# Patient Record
Sex: Male | Born: 1985
Health system: Southern US, Community
[De-identification: ages and names within clinical notes are randomized; demographics above are authoritative.]

## PROBLEM LIST (undated history)

## (undated) DIAGNOSIS — J45909 Unspecified asthma, uncomplicated: Secondary | ICD-10-CM

## (undated) DIAGNOSIS — B019 Varicella without complication: Secondary | ICD-10-CM

## (undated) DIAGNOSIS — E559 Vitamin D deficiency, unspecified: Secondary | ICD-10-CM

## (undated) DIAGNOSIS — R569 Unspecified convulsions: Secondary | ICD-10-CM

## (undated) DIAGNOSIS — G43909 Migraine, unspecified, not intractable, without status migrainosus: Secondary | ICD-10-CM

## (undated) DIAGNOSIS — N50811 Right testicular pain: Secondary | ICD-10-CM

## (undated) HISTORY — DX: Varicella without complication: B01.9

## (undated) HISTORY — DX: Vitamin D deficiency, unspecified: E55.9

## (undated) HISTORY — DX: Right testicular pain: N50.811

## (undated) HISTORY — PX: WISDOM TOOTH EXTRACTION: SHX21

## (undated) HISTORY — DX: Unspecified asthma, uncomplicated: J45.909

## (undated) HISTORY — DX: Migraine, unspecified, not intractable, without status migrainosus: G43.909

## (undated) HISTORY — DX: Unspecified convulsions: R56.9

---

## 2006-01-04 DIAGNOSIS — K219 Gastro-esophageal reflux disease without esophagitis: Secondary | ICD-10-CM | POA: Insufficient documentation

## 2008-07-15 DIAGNOSIS — G43909 Migraine, unspecified, not intractable, without status migrainosus: Secondary | ICD-10-CM | POA: Insufficient documentation

## 2016-06-02 DIAGNOSIS — J019 Acute sinusitis, unspecified: Secondary | ICD-10-CM | POA: Diagnosis not present

## 2017-01-20 DIAGNOSIS — L255 Unspecified contact dermatitis due to plants, except food: Secondary | ICD-10-CM | POA: Diagnosis not present

## 2017-06-08 DIAGNOSIS — H6693 Otitis media, unspecified, bilateral: Secondary | ICD-10-CM | POA: Diagnosis not present

## 2017-06-15 DIAGNOSIS — H66012 Acute suppurative otitis media with spontaneous rupture of ear drum, left ear: Secondary | ICD-10-CM | POA: Diagnosis not present

## 2017-07-14 DIAGNOSIS — H6121 Impacted cerumen, right ear: Secondary | ICD-10-CM | POA: Diagnosis not present

## 2017-07-14 DIAGNOSIS — H6980 Other specified disorders of Eustachian tube, unspecified ear: Secondary | ICD-10-CM | POA: Diagnosis not present

## 2017-07-14 DIAGNOSIS — J301 Allergic rhinitis due to pollen: Secondary | ICD-10-CM | POA: Diagnosis not present

## 2017-11-07 DIAGNOSIS — S838X2A Sprain of other specified parts of left knee, initial encounter: Secondary | ICD-10-CM | POA: Diagnosis not present

## 2017-11-23 DIAGNOSIS — M25562 Pain in left knee: Secondary | ICD-10-CM | POA: Diagnosis not present

## 2017-11-28 DIAGNOSIS — M25562 Pain in left knee: Secondary | ICD-10-CM | POA: Diagnosis not present

## 2017-11-30 DIAGNOSIS — M25562 Pain in left knee: Secondary | ICD-10-CM | POA: Diagnosis not present

## 2017-12-12 ENCOUNTER — Encounter

## 2017-12-15 ENCOUNTER — Ambulatory Visit (INDEPENDENT_AMBULATORY_CARE_PROVIDER_SITE_OTHER): Payer: BLUE CROSS/BLUE SHIELD | Admitting: Internal Medicine

## 2017-12-15 ENCOUNTER — Encounter: Payer: Self-pay | Admitting: Internal Medicine

## 2017-12-15 ENCOUNTER — Encounter

## 2017-12-15 ENCOUNTER — Other Ambulatory Visit: Payer: Self-pay | Admitting: Internal Medicine

## 2017-12-15 VITALS — BP 120/70 | HR 65 | Temp 98.2°F | Ht 66.0 in | Wt 194.6 lb

## 2017-12-15 DIAGNOSIS — Z0184 Encounter for antibody response examination: Secondary | ICD-10-CM | POA: Diagnosis not present

## 2017-12-15 DIAGNOSIS — Z1329 Encounter for screening for other suspected endocrine disorder: Secondary | ICD-10-CM | POA: Diagnosis not present

## 2017-12-15 DIAGNOSIS — R1031 Right lower quadrant pain: Secondary | ICD-10-CM

## 2017-12-15 DIAGNOSIS — Z87898 Personal history of other specified conditions: Secondary | ICD-10-CM | POA: Diagnosis not present

## 2017-12-15 DIAGNOSIS — E559 Vitamin D deficiency, unspecified: Secondary | ICD-10-CM

## 2017-12-15 DIAGNOSIS — G43009 Migraine without aura, not intractable, without status migrainosus: Secondary | ICD-10-CM | POA: Diagnosis not present

## 2017-12-15 DIAGNOSIS — Z1159 Encounter for screening for other viral diseases: Secondary | ICD-10-CM | POA: Diagnosis not present

## 2017-12-15 DIAGNOSIS — M25562 Pain in left knee: Secondary | ICD-10-CM

## 2017-12-15 DIAGNOSIS — Z0000 Encounter for general adult medical examination without abnormal findings: Secondary | ICD-10-CM | POA: Diagnosis not present

## 2017-12-15 LAB — CBC WITH DIFFERENTIAL/PLATELET
BASOS ABS: 0 10*3/uL (ref 0.0–0.1)
BASOS PCT: 0.7 % (ref 0.0–3.0)
EOS ABS: 0 10*3/uL (ref 0.0–0.7)
Eosinophils Relative: 1.2 % (ref 0.0–5.0)
HEMATOCRIT: 44.9 % (ref 39.0–52.0)
HEMOGLOBIN: 15.3 g/dL (ref 13.0–17.0)
LYMPHS PCT: 32.8 % (ref 12.0–46.0)
Lymphs Abs: 1.3 10*3/uL (ref 0.7–4.0)
MCHC: 34.1 g/dL (ref 30.0–36.0)
MCV: 84.5 fl (ref 78.0–100.0)
Monocytes Absolute: 0.4 10*3/uL (ref 0.1–1.0)
Monocytes Relative: 8.9 % (ref 3.0–12.0)
Neutro Abs: 2.2 10*3/uL (ref 1.4–7.7)
Neutrophils Relative %: 56.4 % (ref 43.0–77.0)
Platelets: 227 10*3/uL (ref 150.0–400.0)
RBC: 5.32 Mil/uL (ref 4.22–5.81)
RDW: 13.3 % (ref 11.5–15.5)
WBC: 4 10*3/uL (ref 4.0–10.5)

## 2017-12-15 LAB — VITAMIN D 25 HYDROXY (VIT D DEFICIENCY, FRACTURES): VITD: 20.2 ng/mL — AB (ref 30.00–100.00)

## 2017-12-15 LAB — COMPREHENSIVE METABOLIC PANEL
ALBUMIN: 5 g/dL (ref 3.5–5.2)
ALT: 14 U/L (ref 0–53)
AST: 16 U/L (ref 0–37)
Alkaline Phosphatase: 70 U/L (ref 39–117)
BILIRUBIN TOTAL: 0.6 mg/dL (ref 0.2–1.2)
BUN: 13 mg/dL (ref 6–23)
CALCIUM: 10 mg/dL (ref 8.4–10.5)
CHLORIDE: 104 meq/L (ref 96–112)
CO2: 31 mEq/L (ref 19–32)
Creatinine, Ser: 1.05 mg/dL (ref 0.40–1.50)
GFR: 87.47 mL/min (ref >60.00)
Glucose, Bld: 99 mg/dL (ref 70–99)
Potassium: 4.8 mEq/L (ref 3.5–5.1)
Sodium: 140 mEq/L (ref 135–145)
Total Protein: 7.4 g/dL (ref 6.0–8.3)

## 2017-12-15 LAB — T4, FREE: FREE T4: 0.87 ng/dL (ref 0.60–1.60)

## 2017-12-15 LAB — TSH: TSH: 2.01 u[IU]/mL (ref 0.35–4.50)

## 2017-12-15 MED ORDER — CHOLECALCIFEROL 1.25 MG (50000 UT) PO CAPS: 50000.0000 [IU] | ORAL_CAPSULE | ORAL | 1 refills | Status: DC

## 2017-12-15 NOTE — Progress Notes (Signed)
Pre visit review using our clinic review tool, if applicable. No additional management support is needed unless otherwise documented below in the visit note. 

## 2017-12-15 NOTE — Patient Instructions (Addendum)
Consider Tdap after disc. With mom and if you are sure this did not cause seizures  F/u in 1-2 months  Will order ct ab pelvis with contrast at [facility]  Kidney Stones Kidney stones (urolithiasis) are solid, rock-like deposits that form inside of the organs that make urine (kidneys). A kidney stone may form in a kidney and move into the bladder, where it can cause intense pain and block the flow of urine. Kidney stones are created when high levels of certain minerals are found in the urine. They are usually passed through urination, but in some cases, medical treatment may be needed to remove them. What are the causes? Kidney stones may be caused by:  A condition in which certain glands produce too much parathyroid hormone (primary hyperparathyroidism), which causes too much calcium buildup in the blood.  Buildup of uric acid crystals in the bladder (hyperuricosuria). Uric acid is a chemical that the body produces when you eat certain foods. It usually exits the body in the urine.  Narrowing (stricture) of one or both of the tubes that drain urine from the kidneys to the bladder (ureters).  A kidney blockage that is present at birth (congenital obstruction).  Past surgery on the kidney or the ureters, such as gastric bypass surgery.  What increases the risk? The following factors make you more likely to develop kidney stones:  Having had a kidney stone in the past.  Having a family history of kidney stones.  Not drinking enough water.  Eating a diet that is high in protein, salt (sodium), or sugar.  Being overweight or obese.  What are the signs or symptoms? Symptoms of a kidney stone may include:  Nausea.  Vomiting.  Blood in the urine (hematuria).  Pain in the side of the abdomen, right below the ribs (flank pain). Pain usually spreads (radiates) to the groin.  Needing to urinate frequently or urgently.  How is this diagnosed? This condition may be diagnosed based  on:  Your medical history.  A physical exam.  Blood tests.  Urine tests.  CT scan.  Abdominal X-ray.  A procedure to examine the inside of the bladder (cystoscopy).  How is this treated? Treatment for kidney stones depends on the size, location, and makeup of the stones. Treatment may involve:  Analyzing your urine before and after you pass the stone through urination.  Being monitored at the hospital until you pass the stone through urination.  Increasing your fluid intake and decreasing the amount of calcium and protein in your diet.  A procedure to break up kidney stones in the bladder using: ? A focused beam of light (laser therapy). ? Shock waves (extracorporeal shock wave lithotripsy).  Surgery to remove kidney stones. This may be needed if you have severe pain or have stones that block your urinary tract.  Follow these instructions at home: Eating and drinking   Drink enough fluid to keep your urine clear or pale yellow. This will help you to pass the kidney stone.  If directed, change your diet. This may include: ? Limiting how much sodium you eat. ? Eating more fruits and vegetables. ? Limiting how much meat, poultry, fish, and eggs you eat.  Follow instructions from your health care provider about eating or drinking restrictions. General instructions  Collect urine samples as told by your health care provider. You may need to collect a urine sample: ? 24 hours after you pass the stone. ? 8-12 weeks after passing the kidney stone, and  every 6-12 months after that.  Strain your urine every time you urinate, for as long as directed. Use the strainer that your health care provider recommends.  Do not throw out the kidney stone after passing it. Keep the stone so it can be tested by your health care provider. Testing the makeup of your kidney stone may help prevent you from getting kidney stones in the future.  Take over-the-counter and prescription medicines  only as told by your health care provider.  Keep all follow-up visits as told by your health care provider. This is important. You may need follow-up X-rays or ultrasounds to make sure that your stone has passed. How is this prevented? To prevent another kidney stone:  Drink enough fluid to keep your urine clear or pale yellow. This is the best way to prevent kidney stones.  Eat a healthy diet and follow recommendations from your health care provider about foods to avoid. You may be instructed to eat a low-protein diet. Recommendations vary depending on the type of kidney stone that you have.  Maintain a healthy weight.  Contact a health care provider if:  You have pain that gets worse or does not get better with medicine. Get help right away if:  You have a fever or chills.  You develop severe pain.  You develop new abdominal pain.  You faint.  You are unable to urinate. This information is not intended to replace advice given to you by your health care provider. Make sure you discuss any questions you have with your health care provider. Document Released: 06/07/2005 Document Revised: 12/26/2015 Document Reviewed: 11/21/2015 Elsevier Interactive Patient Education  2018 [employer].  Appendicitis The appendix is a finger-shaped tube that is attached to the large intestine. Appendicitis is inflammation of the appendix. Without treatment, appendicitis can cause the appendix to tear (rupture). A ruptured appendix can lead to a life-threatening infection. It can also lead to the formation of a painful collection of pus (abscess) in the appendix. What are the causes? This condition may be caused by a blockage in the appendix that leads to infection. The blockage can be due to:  A ball of stool.  Enlarged lymph glands.  In some cases, the cause may not be known. What increases the risk? This condition is more likely to develop in people who are 40-56 years of age. What are the  signs or symptoms? Symptoms of this condition include:  Pain around the belly button that moves toward the lower right abdomen. The pain can become more severe as time passes. It gets worse with coughing or sudden movements.  Tenderness in the lower right abdomen.  Nausea.  Vomiting.  Loss of appetite.  Fever.  Constipation.  Diarrhea.  Generally not feeling well.  How is this diagnosed? This condition may be diagnosed with:  A physical exam.  Blood tests.  Urine test.  To confirm the diagnosis, an ultrasound, MRI, or CT scan may be done. How is this treated? This condition is usually treated by taking out the appendix (appendectomy). There are two methods for doing an appendectomy:  Open appendectomy. In this surgery, the appendix is removed through a large cut (incision) that is made in the lower right abdomen. This procedure may be recommended if: ? You have major scarring from a previous surgery. ? You have a bleeding disorder. ? You are pregnant and are near term. ? You have a condition that makes the laparoscopic procedure impossible, such as an advanced infection  or a ruptured appendix.  Laparoscopic appendectomy. In this surgery, the appendix is removed through small incisions. This procedure usually causes less pain and fewer problems than an open appendectomy. It also has a shorter recovery time.  If the appendix has ruptured and an abscess has formed, a drain may be placed into the abscess to remove fluid and antibiotic medicines may be given through an IV tube. The appendix may or may not need to be removed. This information is not intended to replace advice given to you by your health care provider. Make sure you discuss any questions you have with your health care provider. Document Released: 06/07/2005 Document Revised: 10/15/2015 Document Reviewed: 10/23/2014 Elsevier Interactive Patient Education  2017 [employer]  Hernia, Adult A hernia is the  bulging of an organ or tissue through a weak spot in the muscles of the abdomen (abdominal wall). Hernias develop most often near the navel or groin. There are many kinds of hernias. Common kinds include:  Femoral hernia. This kind of hernia develops under the groin in the upper thigh area.  Inguinal hernia. This kind of hernia develops in the groin or scrotum.  Umbilical hernia. This kind of hernia develops near the navel.  Hiatal hernia. This kind of hernia causes part of the stomach to be pushed up into the chest.  Incisional hernia. This kind of hernia bulges through a scar from an abdominal surgery.  What are the causes? This condition may be caused by:  Heavy lifting.  Coughing over a long period of time.  Straining to have a bowel movement.  An incision made during an abdominal surgery.  A birth defect (congenital defect).  Excess weight or obesity.  Smoking.  Poor nutrition.  Cystic fibrosis.  Excess fluid in the abdomen.  Undescended testicles.  What are the signs or symptoms? Symptoms of a hernia include:  A lump on the abdomen. This is the first sign of a hernia. The lump may become more obvious with standing, straining, or coughing. It may get bigger over time if it is not treated or if the condition causing it is not treated.  Pain. A hernia is usually painless, but it may become painful over time if treatment is delayed. The pain is usually dull and may get worse with standing or lifting heavy objects.  Sometimes a hernia gets tightly squeezed in the weak spot (strangulated) or stuck there (incarcerated) and causes additional symptoms. These symptoms may include:  Vomiting.  Nausea.  Constipation.  Irritability.  How is this diagnosed? A hernia may be diagnosed with:  A physical exam. During the exam your health care provider may ask you to cough or to make a specific movement, because a hernia is usually more visible when you move.  Imaging  tests. These can include: ? X-rays. ? Ultrasound. ? CT scan.  How is this treated? A hernia that is small and painless may not need to be treated. A hernia that is large or painful may be treated with surgery. Inguinal hernias may be treated with surgery to prevent incarceration or strangulation. Strangulated hernias are always treated with surgery, because lack of blood to the trapped organ or tissue can cause it to die. Surgery to treat a hernia involves pushing the bulge back into place and repairing the weak part of the abdomen. Follow these instructions at home:  Avoid straining.  Do not lift anything heavier than 10 lb (4.5 kg).  Lift with your leg muscles, not your back muscles.  This helps avoid strain.  When coughing, try to cough gently.  Prevent constipation. Constipation leads to straining with bowel movements, which can make a hernia worse or cause a hernia repair to break down. You can prevent constipation by: ? Eating a high-fiber diet that includes plenty of fruits and vegetables. ? Drinking enough fluids to keep your urine clear or pale yellow. Aim to drink 6-8 glasses of water per day. ? Using a stool softener as directed by your health care provider.  Lose weight, if you are overweight.  Do not use any tobacco products, including cigarettes, chewing tobacco, or electronic cigarettes. If you need help quitting, ask your health care provider.  Keep all follow-up visits as directed by your health care provider. This is important. Your health care provider may need to monitor your condition. Contact a health care provider if:  You have swelling, redness, and pain in the affected area.  Your bowel habits change. Get help right away if:  You have a fever.  You have abdominal pain that is getting worse.  You feel nauseous or you vomit.  You cannot push the hernia back in place by gently pressing on it while you are lying down.  The hernia: ? Changes in shape or  size. ? Is stuck outside the abdomen. ? Becomes discolored. ? Feels hard or tender. This information is not intended to replace advice given to you by your health care provider. Make sure you discuss any questions you have with your health care provider. Document Released: 06/07/2005 Document Revised: 11/05/2015 Document Reviewed: 04/17/2014 Elsevier Interactive Patient Education  2017 Elsevier Inc.  Abdominal Pain, Adult Abdominal pain can be caused by many things. Often, abdominal pain is not serious and it gets better with no treatment or by being treated at home. However, sometimes abdominal pain is serious. Your health care provider will do a medical history and a physical exam to try to determine the cause of your abdominal pain. Follow these instructions at home:  Take over-the-counter and prescription medicines only as told by your health care provider. Do not take a laxative unless told by your health care provider.  Drink enough fluid to keep your urine clear or pale yellow.  Watch your condition for any changes.  Keep all follow-up visits as told by your health care provider. This is important. Contact a health care provider if:  Your abdominal pain changes or gets worse.  You are not hungry or you lose weight without trying.  You are constipated or have diarrhea for more than 2-3 days.  You have pain when you urinate or have a bowel movement.  Your abdominal pain wakes you up at night.  Your pain gets worse with meals, after eating, or with certain foods.  You are throwing up and cannot keep anything down.  You have a fever. Get help right away if:  Your pain does not go away as soon as your health care provider told you to expect.  You cannot stop throwing up.  Your pain is only in areas of the abdomen, such as the right side or the left lower portion of the abdomen.  You have bloody or black stools, or stools that look like tar.  You have severe pain,  cramping, or bloating in your abdomen.  You have signs of dehydration, such as: ? Dark urine, very little urine, or no urine. ? Cracked lips. ? Dry mouth. ? Sunken eyes. ? Sleepiness. ? Weakness. This information is not  intended to replace advice given to you by your health care provider. Make sure you discuss any questions you have with your health care provider. Document Released: 03/17/2005 Document Revised: 12/26/2015 Document Reviewed: 11/19/2015 Elsevier Interactive Patient Education  [employer].

## 2017-12-15 NOTE — Progress Notes (Signed)
Chief Complaint  Patient presents with  . New Patient (Initial Visit)   NP with wife  1. C/o RLQ ab pain x several months he thinks he may have hernia but no bulge at times pain is constant worse with heavy lifting and pain radiates to right groin. He also has been having cold spells x 2 months. Nothing tried  2. Pertussis unclear if had seizure when was a kid mother says so baby on the way and wants to know if should get vaccine no seizures since age 31-6 y.o  3. C/o left knee injury while running x 1.5 month ago in PT 2x per week and getter better  4. H/o migraines worse with stress w/o aura bothered by light and sound tries ibuprofen and excedrine with some help drinks 2-3 cups coffee daily no soda or tea sleeping 6-8 hrs qhs mom and sister h/o migraines.    Review of Systems  Constitutional: Positive for chills. Negative for fever and weight loss.  HENT: Negative for hearing loss.   Eyes: Negative for blurred vision.  Respiratory: Negative for shortness of breath.   Cardiovascular: Negative for chest pain.  Gastrointestinal: Positive for abdominal pain. Negative for nausea and vomiting.  Musculoskeletal: Positive for joint pain.  Skin: Negative for rash.  Neurological: Negative for seizures and headaches.  Psychiatric/Behavioral: Negative for depression.   Past Medical History:  Diagnosis Date  . Asthma    childhood with exercise   . Chicken pox   . Migraines   . Seizures ([place])    Past Surgical History:  Procedure Laterality Date  . WISDOM TOOTH EXTRACTION     Family History  Problem Relation Age of Onset  . Arthritis Mother   . Migraines Mother   . Cancer Father        skin mm  . Hypertension Father   . Migraines Sister   . COPD Maternal Grandmother   . COPD Paternal Grandfather   . Diabetes Paternal Grandfather   . Heart disease Paternal Grandfather    Social History   Socioeconomic History  . Marital status: Married    Spouse name: Not on file  . Number of  children: Not on file  . Years of education: Not on file  . Highest education level: Not on file  Occupational History  . Not on file  Social Needs  . Financial resource strain: Not on file  . Food insecurity:    Worry: Not on file    Inability: Not on file  . Transportation needs:    Medical: Not on file    Non-medical: Not on file  Tobacco Use  . Smoking status: Former Research scientist (life sciences)  . Smokeless tobacco: Never Used  . Tobacco comment: age [age] 1pk lasted 4-5 days   Substance and Sexual Activity  . Alcohol use: Yes  . Drug use: Not Currently  . Sexual activity: Yes    Partners: Female  Lifestyle  . Physical activity:    Days per week: Not on file    Minutes per session: Not on file  . Stress: Not on file  Relationships  . Social connections:    Talks on phone: Not on file    Gets together: Not on file    Attends religious service: Not on file    Active member of club or organization: Not on file    Attends meetings of clubs or organizations: Not on file    Relationship status: Not on file  . Intimate partner violence:  Fear of current or ex partner: Not on file    Emotionally abused: Not on file    Physically abused: Not on file    Forced sexual activity: Not on file  Other Topics Concern  . Not on file  Social History Narrative   Married wife expecting baby 12/2017    [employer] ed program manager    No guns    Wears selt belt    Safe in relationship       Current Meds  Medication Sig  . loratadine (CLARITIN) 10 MG tablet Take 10 mg by mouth daily.  Marland Kitchen omeprazole (PRILOSEC) 10 MG capsule Take 10 mg by mouth daily.   Allergies  Allergen Reactions  . Pertussis Vaccines     ? Cause of seizure  . Tape Itching    Rash    No results found for this or any previous visit (from the past 2160 hour(s)). Objective  Body mass index is 31.41 kg/m. Wt Readings from Last 3 Encounters:  12/15/17 194 lb 9.6 oz (88.3 kg)   Temp Readings from Last 3 Encounters:  12/15/17 98.2  F (36.8 C) (Oral)   BP Readings from Last 3 Encounters:  12/15/17 120/70   Pulse Readings from Last 3 Encounters:  12/15/17 65    Physical Exam  Constitutional: He is oriented to person, place, and time. Vital signs are normal. He appears well-developed and well-nourished. He is cooperative.  HENT:  Head: Normocephalic and atraumatic.  Mouth/Throat: Oropharynx is clear and moist and mucous membranes are normal.  Eyes: Pupils are equal, round, and reactive to light. Conjunctivae are normal.  Cardiovascular: Normal rate, regular rhythm and normal heart sounds.  Pulmonary/Chest: Effort normal and breath sounds normal.  Abdominal: Soft. Bowel sounds are normal. He exhibits no mass. There is tenderness in the right lower quadrant. There is no rebound and no guarding. No hernia.    Neurological: He is alert and oriented to person, place, and time. Gait normal.  Skin: Skin is warm, dry and intact.     KP and acne   Psychiatric: He has a normal mood and affect. His speech is normal and behavior is normal. Judgment and thought content normal. Cognition and memory are normal.  Nursing note and vitals reviewed.   Assessment   1. RLQ ab pain ddx kidney stone, appedix, hernia   2. H/o seizures ? If to pertussis last seizure age 67 or 32 y.o  3. Left knee pain improved with PT  4. Migraines  5. HM Plan   1. Labs and CT ab/pelvis with contrast  2. Hold Tdap until finds out more info from mom  3. Cont pt 4. Disc further tx migraines if needed in future  Will cont nsaids and otc excedrine  5.  Had flu shot 2018  Td had had 2004 not had ap since child  Hep B immune  Check MMR  Labs today  Consider lipid fasting in future  No STD testing    Provider: Dr. Olivia Mackie McLean-Scocuzza-[facility]

## 2017-12-16 LAB — URINALYSIS, ROUTINE W REFLEX MICROSCOPIC
BILIRUBIN UA: NEGATIVE
Glucose, UA: NEGATIVE
KETONES UA: NEGATIVE
LEUKOCYTES UA: NEGATIVE
Nitrite, UA: NEGATIVE
PH UA: 8.5 — AB (ref 5.0–7.5)
PROTEIN UA: NEGATIVE
RBC UA: NEGATIVE
Specific Gravity, UA: 1.006 (ref 1.005–1.030)
UUROB: 0.2 mg/dL (ref 0.2–1.0)

## 2017-12-19 LAB — MEASLES/MUMPS/RUBELLA IMMUNITY
MUMPS IGG: 40.5 [AU]/ml
RUBELLA: 1.62 {index}

## 2017-12-20 ENCOUNTER — Encounter: Payer: Self-pay | Admitting: *Deleted

## 2017-12-20 ENCOUNTER — Encounter (INDEPENDENT_AMBULATORY_CARE_PROVIDER_SITE_OTHER): Payer: Self-pay

## 2017-12-24 DIAGNOSIS — Z23 Encounter for immunization: Secondary | ICD-10-CM | POA: Diagnosis not present

## 2017-12-28 ENCOUNTER — Ambulatory Visit
Admission: RE | Admit: 2017-12-28 | Discharge: 2017-12-28 | Disposition: A | Discharge status: Home / Self Care | Payer: BLUE CROSS/BLUE SHIELD | Source: Ambulatory Visit | Attending: Internal Medicine | Admitting: Internal Medicine

## 2017-12-28 DIAGNOSIS — R1031 Right lower quadrant pain: Secondary | ICD-10-CM | POA: Diagnosis not present

## 2017-12-28 MED ORDER — IOPAMIDOL (ISOVUE-300) INJECTION 61%
100.0000 mL | Freq: Once | INTRAVENOUS | Status: AC | PRN
  Administered 2017-12-28: 100 mL via INTRAVENOUS

## 2017-12-29 ENCOUNTER — Encounter: Payer: Self-pay | Admitting: Internal Medicine

## 2018-02-27 ENCOUNTER — Ambulatory Visit: Payer: BLUE CROSS/BLUE SHIELD | Admitting: Internal Medicine

## 2018-02-27 DIAGNOSIS — Z0289 Encounter for other administrative examinations: Secondary | ICD-10-CM

## 2018-04-02 DIAGNOSIS — Z23 Encounter for immunization: Secondary | ICD-10-CM | POA: Diagnosis not present

## 2018-05-08 ENCOUNTER — Telehealth: Payer: Self-pay

## 2018-05-08 ENCOUNTER — Other Ambulatory Visit: Payer: Self-pay | Admitting: Internal Medicine

## 2018-05-08 DIAGNOSIS — N50819 Testicular pain, unspecified: Secondary | ICD-10-CM

## 2018-05-08 NOTE — Telephone Encounter (Signed)
Please order testicular US for testicular pain   Pt also needs to schedule f/u with me   Thanks tMS

## 2018-05-08 NOTE — Telephone Encounter (Signed)
Ultrasound is scheduled 11/21 and his follow up with you is on 11/26. Thx, melissa

## 2018-05-08 NOTE — Telephone Encounter (Signed)
Copied from CRM [phone]. Topic: Referral - Request for Referral >> May 08, 2018 10:57 AM Crist InfanteHarrald, Kathy J wrote: Has patient seen PCP for this complaint? yes Pt had CT done in June, and was advised to have an ultrasound it that did not show anything. Pt just had a baby, and that is why he waited so long to follow up with this. Pt states he is still having testicular pain. If pt doesn not answer, ok to call wife.

## 2018-05-11 ENCOUNTER — Ambulatory Visit
Admission: RE | Admit: 2018-05-11 | Discharge: 2018-05-11 | Disposition: A | Discharge status: Home / Self Care | Payer: BLUE CROSS/BLUE SHIELD | Source: Ambulatory Visit | Attending: Internal Medicine | Admitting: Internal Medicine

## 2018-05-11 DIAGNOSIS — N50819 Testicular pain, unspecified: Secondary | ICD-10-CM

## 2018-05-12 ENCOUNTER — Other Ambulatory Visit: Payer: Self-pay | Admitting: Internal Medicine

## 2018-05-12 DIAGNOSIS — N50819 Testicular pain, unspecified: Secondary | ICD-10-CM

## 2018-05-12 DIAGNOSIS — R1031 Right lower quadrant pain: Secondary | ICD-10-CM

## 2018-05-16 ENCOUNTER — Ambulatory Visit (INDEPENDENT_AMBULATORY_CARE_PROVIDER_SITE_OTHER): Payer: BLUE CROSS/BLUE SHIELD | Admitting: Internal Medicine

## 2018-05-16 ENCOUNTER — Encounter: Payer: Self-pay | Admitting: Internal Medicine

## 2018-05-16 VITALS — BP 122/96 | HR 71 | Temp 97.6°F | Ht 66.0 in | Wt 204.8 lb

## 2018-05-16 DIAGNOSIS — L858 Other specified epidermal thickening: Secondary | ICD-10-CM | POA: Diagnosis not present

## 2018-05-16 DIAGNOSIS — E669 Obesity, unspecified: Secondary | ICD-10-CM | POA: Insufficient documentation

## 2018-05-16 DIAGNOSIS — Z0000 Encounter for general adult medical examination without abnormal findings: Secondary | ICD-10-CM | POA: Diagnosis not present

## 2018-05-16 DIAGNOSIS — Z13220 Encounter for screening for lipoid disorders: Secondary | ICD-10-CM | POA: Diagnosis not present

## 2018-05-16 DIAGNOSIS — N50811 Right testicular pain: Secondary | ICD-10-CM | POA: Insufficient documentation

## 2018-05-16 DIAGNOSIS — E559 Vitamin D deficiency, unspecified: Secondary | ICD-10-CM

## 2018-05-16 NOTE — Progress Notes (Signed)
Chief Complaint  Patient presents with  . Follow-up   Annual  1. She c/o right testicular pain and groin pain in scrotum feels like pressure/pain/pulling/stabbing sensation worse with lying in bed at times ? Trigger. No bulge. At times pt notes testicle on right is out of position and sitting higher on the right and angle changes  2. Vit D def taking D3 50K weekly     Review of Systems  Constitutional: Negative for weight loss.  HENT: Negative for hearing loss.   Eyes: Negative for blurred vision.  Respiratory: Negative for shortness of breath.   Cardiovascular: Negative for chest pain.  Gastrointestinal: Negative for abdominal pain.  Genitourinary:       +right testicular pain    Musculoskeletal: Negative for falls.  Skin: Negative for rash.  Neurological: Negative for headaches.  Psychiatric/Behavioral: Negative for depression.   Past Medical History:  Diagnosis Date  . Asthma    childhood with exercise   . Chicken pox   . Migraines   . Pain in right testicle   . Seizures ([place])   . Vitamin D deficiency    Past Surgical History:  Procedure Laterality Date  . WISDOM TOOTH EXTRACTION     Family History  Problem Relation Age of Onset  . Arthritis Mother   . Migraines Mother   . Cancer Father        skin mm  . Hypertension Father   . Migraines Sister   . COPD Maternal Grandmother   . COPD Paternal Grandfather   . Diabetes Paternal Grandfather   . Heart disease Paternal Grandfather    Social History   Socioeconomic History  . Marital status: Married    Spouse name: Not on file  . Number of children: Not on file  . Years of education: Not on file  . Highest education level: Not on file  Occupational History  . Not on file  Social Needs  . Financial resource strain: Not on file  . Food insecurity:    Worry: Not on file    Inability: Not on file  . Transportation needs:    Medical: Not on file    Non-medical: Not on file  Tobacco Use  . Smoking status:  Former Research scientist (life sciences)  . Smokeless tobacco: Never Used  . Tobacco comment: age [age] 1pk lasted 4-5 days   Substance and Sexual Activity  . Alcohol use: Yes  . Drug use: Not Currently  . Sexual activity: Yes    Partners: Female  Lifestyle  . Physical activity:    Days per week: Not on file    Minutes per session: Not on file  . Stress: Not on file  Relationships  . Social connections:    Talks on phone: Not on file    Gets together: Not on file    Attends religious service: Not on file    Active member of club or organization: Not on file    Attends meetings of clubs or organizations: Not on file    Relationship status: Not on file  . Intimate partner violence:    Fear of current or ex partner: Not on file    Emotionally abused: Not on file    Physically abused: Not on file    Forced sexual activity: Not on file  Other Topics Concern  . Not on file  Social History Narrative   Married wife expecting baby 12/2017    HS ed program manager    No guns  Wears selt belt    Safe in relationship    4 month girl as of 05/16/18    Current Meds  Medication Sig  . Cholecalciferol 50000 units capsule Take 1 capsule (50,000 Units total) by mouth once a week.  . loratadine (CLARITIN) 10 MG tablet Take 10 mg by mouth daily.  Marland Kitchen omeprazole (PRILOSEC) 10 MG capsule Take 10 mg by mouth daily.   Allergies  Allergen Reactions  . Pertussis Vaccines     ? Cause of seizure  . Tape Itching    Rash    No results found for this or any previous visit (from the past 2160 hour(s)). Objective  Body mass index is 33.06 kg/m. Wt Readings from Last 3 Encounters:  05/16/18 204 lb 12.8 oz (92.9 kg)  12/15/17 194 lb 9.6 oz (88.3 kg)   Temp Readings from Last 3 Encounters:  05/16/18 97.6 F (36.4 C) (Oral)  12/15/17 98.2 F (36.8 C) (Oral)   BP Readings from Last 3 Encounters:  05/16/18 (!) 122/96  12/15/17 120/70   Pulse Readings from Last 3 Encounters:  05/16/18 71  12/15/17 65     Physical Exam  Constitutional: He is oriented to person, place, and time. Vital signs are normal. He appears well-developed and well-nourished. He is cooperative.  HENT:  Head: Normocephalic and atraumatic.  Mouth/Throat: Oropharynx is clear and moist and mucous membranes are normal.  Eyes: Pupils are equal, round, and reactive to light. Conjunctivae are normal.  Cardiovascular: Normal rate, regular rhythm and normal heart sounds.  Pulmonary/Chest: Effort normal and breath sounds normal.  Neurological: He is alert and oriented to person, place, and time. Gait normal.  Skin: Skin is warm, dry and intact.  Keratosis pilaris to trunk, arms.    Psychiatric: He has a normal mood and affect. His speech is normal and behavior is normal. Judgment and thought content normal. Cognition and memory are normal.  Nursing note and vitals reviewed. benign nevi to trunk   Assessment   1. Annual 2. Right testicular pain  3. Vitamin D def  Plan   1.  Had flu shot 2019 Tdap utd  Hep B immune  MMR immune  sch fasting lipid  No STD testing  rec exercise to lose and healthy diet choices Can exfoliate and use amlactin for KP to body   2. Refer to urology CT ab/pelvis and testicular US negative  3. D3 weekly x 6 months then otc D3 5000 IU qd    Provider: Dr. Olivia Mackie McLean-Scocuzza-[facility]

## 2018-05-16 NOTE — Patient Instructions (Addendum)
Please see urology about testicular pain   Vitamin D x6 months weekly then daily 5000 IU D3 otc starting month 7  Amlactin lotion or exforiate for keratosis pilaris    Vitamin D Deficiency Vitamin D deficiency is when your body does not have enough vitamin D. Vitamin D is important to your body for many reasons:  It helps the body to absorb two important minerals, called calcium and phosphorus.  It plays a role in bone health.  It may help to prevent some diseases, such as diabetes and multiple sclerosis.  It plays a role in muscle function, including heart function.  You can get vitamin D by:  Eating foods that naturally contain vitamin D.  Eating or drinking milk or other dairy products that have vitamin D added to them.  Taking a vitamin D supplement or a multivitamin supplement that contains vitamin D.  Being in the sun. Your body naturally makes vitamin D when your skin is exposed to sunlight. Your body changes the sunlight into a form of the vitamin that the body can use.  If vitamin D deficiency is severe, it can cause a condition in which your bones become soft. In adults, this condition is called osteomalacia. In children, this condition is called rickets. What are the causes? Vitamin D deficiency may be caused by:  Not eating enough foods that contain vitamin D.  Not getting enough sun exposure.  Having certain digestive system diseases that make it difficult for your body to absorb vitamin D. These diseases include Crohn disease, chronic pancreatitis, and cystic fibrosis.  Having a surgery in which a part of the stomach or a part of the small intestine is removed.  Being obese.  Having chronic kidney disease or liver disease.  What increases the risk? This condition is more likely to develop in:  Older people.  People who do not spend much time outdoors.  People who live in a long-term care facility.  People who have had broken bones.  People with  weak or thin bones (osteoporosis).  People who have a disease or condition that changes how the body absorbs vitamin D.  People who have dark skin.  People who take certain medicines, such as steroid medicines or certain seizure medicines.  People who are overweight or obese.  What are the signs or symptoms? In mild cases of vitamin D deficiency, there may not be any symptoms. If the condition is severe, symptoms may include:  Bone pain.  Muscle pain.  Falling often.  Broken bones caused by a minor injury.  How is this diagnosed? This condition is usually diagnosed with a blood test. How is this treated? Treatment for this condition may depend on what caused the condition. Treatment options include:  Taking vitamin D supplements.  Taking a calcium supplement. Your health care provider will suggest what dose is best for you.  Follow these instructions at home:  Take medicines and supplements only as told by your health care provider.  Eat foods that contain vitamin D. Choices include: ? Fortified dairy products, cereals, or juices. Fortified means that vitamin D has been added to the food. Check the label on the package to be sure. ? Fatty fish, such as salmon or trout. ? Eggs. ? Oysters.  Do not use a tanning bed.  Maintain a healthy weight. Lose weight, if needed.  Keep all follow-up visits as told by your health care provider. This is important. Contact a health care provider if:  Your symptoms  do not go away.  You feel like throwing up (nausea) or you throw up (vomit).  You have fewer bowel movements than usual or it is difficult for you to have a bowel movement (constipation). This information is not intended to replace advice given to you by your health care provider. Make sure you discuss any questions you have with your health care provider. Document Released: 08/30/2011 Document Revised: 11/19/2015 Document Reviewed: 10/23/2014 Elsevier Interactive  Patient Education  2018 [employer].

## 2018-05-16 NOTE — Progress Notes (Signed)
Pre visit review using our clinic review tool, if applicable. No additional management support is needed unless otherwise documented below in the visit note. 

## 2018-06-02 ENCOUNTER — Encounter: Payer: BLUE CROSS/BLUE SHIELD | Admitting: Internal Medicine

## 2018-06-12 ENCOUNTER — Encounter: Payer: BLUE CROSS/BLUE SHIELD | Admitting: Internal Medicine

## 2018-06-12 ENCOUNTER — Other Ambulatory Visit (INDEPENDENT_AMBULATORY_CARE_PROVIDER_SITE_OTHER): Payer: BLUE CROSS/BLUE SHIELD

## 2018-06-12 DIAGNOSIS — Z13220 Encounter for screening for lipoid disorders: Secondary | ICD-10-CM | POA: Diagnosis not present

## 2018-06-12 LAB — LIPID PANEL
CHOL/HDL RATIO: 6
Cholesterol: 188 mg/dL (ref 0–200)
HDL: 33.2 mg/dL — ABNORMAL LOW (ref >39.00)
LDL CALC: 127 mg/dL — AB (ref 0–99)
NONHDL: 154.47
Triglycerides: 139 mg/dL (ref 0.0–149.0)
VLDL: 27.8 mg/dL (ref 0.0–40.0)

## 2018-06-15 ENCOUNTER — Ambulatory Visit: Payer: BLUE CROSS/BLUE SHIELD | Admitting: Urology

## 2018-06-17 DIAGNOSIS — J019 Acute sinusitis, unspecified: Secondary | ICD-10-CM | POA: Diagnosis not present

## 2018-07-18 ENCOUNTER — Encounter: Payer: Self-pay | Admitting: Urology

## 2018-07-18 ENCOUNTER — Ambulatory Visit (INDEPENDENT_AMBULATORY_CARE_PROVIDER_SITE_OTHER): Payer: BLUE CROSS/BLUE SHIELD | Admitting: Urology

## 2018-07-18 VITALS — BP 126/81 | HR 92 | Ht 66.0 in | Wt 200.0 lb

## 2018-07-18 DIAGNOSIS — G4700 Insomnia, unspecified: Secondary | ICD-10-CM | POA: Insufficient documentation

## 2018-07-18 DIAGNOSIS — J309 Allergic rhinitis, unspecified: Secondary | ICD-10-CM | POA: Insufficient documentation

## 2018-07-18 DIAGNOSIS — N50811 Right testicular pain: Secondary | ICD-10-CM

## 2018-07-18 LAB — URINALYSIS, COMPLETE
Bilirubin, UA: NEGATIVE
GLUCOSE, UA: NEGATIVE
KETONES UA: NEGATIVE
LEUKOCYTES UA: NEGATIVE
Nitrite, UA: NEGATIVE
Protein, UA: NEGATIVE
RBC, UA: NEGATIVE
Specific Gravity, UA: 1.025 (ref 1.005–1.030)
Urobilinogen, Ur: 0.2 mg/dL (ref 0.2–1.0)
pH, UA: 6.5 (ref 5.0–7.5)

## 2018-07-18 NOTE — Patient Instructions (Signed)
Pelvic Pain, Male  Pelvic pain is pain in your lower abdomen, below your belly button and between your hips. The pain may start suddenly (be acute), keep coming back (recur), or last a long time (become chronic). Pelvic pain that lasts longer than six months is considered chronic. There are many possible causes of pelvic pain. Sometimes, the cause is not known.  Pelvic pain may affect your:  · Prostate gland.  · Urinary system.  · Digestive tract.  · Musculoskeletal system. Strained muscles or ligaments may cause pelvic pain.  Follow these instructions at home:    Medicines  · Take over-the-counter and prescription medicines only as told by your health care provider.  · If you were prescribed an antibiotic medicine, take it as told by your health care provider. Do not stop taking the antibiotic even if you start to feel better.  Managing pain, stiffness, and swelling    · Take warm water baths (sitz baths). Sitz baths help with relaxing your pelvic floor muscles.  ? For a sitz bath, the water only comes up to your hips and covers your buttocks. A sitz bath may done at home in a bathtub or with a portable sitz bath that fits over the toilet.  · If directed, apply heat to the affected area before you exercise. Use the heat source that your health care provider recommends, such as a moist heat pack or a heating pad.  ? Place a towel between your skin and the heat source.  ? Leave the heat on for 20-30 minutes.  ? Remove the heat if your skin turns bright red. This is especially important if you are unable to feel pain, heat, or cold. You may have a greater risk of getting burned.  General instructions  · Rest as told by your health care provider.  · Keep a journal of your pelvic pain. Write down:  ? When the pain started.  ? Where the pain is located.  ? What seems to make the pain better or worse.  ? Any symptoms you have along with the pain.  · Follow your treatment plan as told by your health care provider. This may  include:  ? Pelvic physical therapy.  ? Yoga, meditation, and exercise.  ? Biofeedback. This process trains you to manage your body's response (physiological response) through breathing techniques and relaxation methods. You will work with a therapist while machines are used to monitor your physical symptoms.  ? Acupuncture. This is a type of treatment that involves stimulating specific points on your body by inserting thin needles through your skin to treat pain.  · Keep all follow-up visits as told by your health care provider. This is important.  Contact a health care provider if:  · Medicine does not help your pain.  · Your pain comes back.  · You have new symptoms.  · You have a fever or chills.  · You are constipated.  · You have blood in your urine or stool.  · You feel weak or light-headed.  Get help right away if:  · You have sudden severe pain.  · Your pain steadily gets worse.  · You have severe pain along with fever, nausea, vomiting, or excessive sweating.  Summary  · Pelvic pain is pain in your lower abdomen, below your belly button and between your hips. There are many possible causes of pelvic pain. Sometimes, the cause is not known.  · Take over-the-counter and prescription medicines only as told   by your health care provider. If you were prescribed an antibiotic medicine, take it as told by your health care provider. Do not stop taking the antibiotic even if you start to feel better.  · Contact a health care provider if you have new or worsening symptoms.  · Get help right away if you have severe pain along with fever, nausea, vomiting, or excessive sweating.  · Keep all follow-up visits as told by your health care provider. This is important.  This information is not intended to replace advice given to you by your health care provider. Make sure you discuss any questions you have with your health care provider.  Document Released: 03/01/2012 Document Revised: 10/26/2017 Document Reviewed:  10/26/2017  Elsevier Interactive Patient Education © 2019 Elsevier Inc.

## 2018-07-18 NOTE — Progress Notes (Signed)
07/18/2018 3:10 PM   Barry Nolan 10/12/1985 [id number]  Referring provider: McLean-Scocuzza, Pasty Spillersracy N, MD [address] [place], [place] [address]  CC: Right groin pain  HPI: I saw [name] in urology clinic in consultation for right groin pain from Dr. Judie GrieveMcLean-Scocuzza.  He is a 33 year old healthy male who reports intermittent right-sided groin and scrotal pain over the last 10 months.  There were no inciting events.  It is not worsened by any activities.  He describes the pain as a dull aching on the inner right thigh that occasionally radiates to the right scrotum and testicle.  It occasionally is sharp.  There are no aggravating or alleviating factors.  He has not tried any medications for this.  This is been worked up with a CT scan of the abdomen pelvis as well as a scrotal ultrasound both of which showed no abnormalities.  He denies a personal history of kidney stones.  He denies gross hematuria or any urinary symptoms.  Severity is mild to moderate.  He denies any history of hernia repairs as a child.   PMH: Past Medical History:  Diagnosis Date  . Asthma    childhood with exercise   . Chicken pox   . Migraines   . Pain in right testicle   . Seizures ([facility])   . Vitamin D deficiency     Surgical History: Past Surgical History:  Procedure Laterality Date  . WISDOM TOOTH EXTRACTION      Allergies:  Allergies  Allergen Reactions  . Pertussis Vaccines     ? Cause of seizure  . Tape Itching    Rash     Family History: Family History  Problem Relation Age of Onset  . Arthritis Mother   . Migraines Mother   . Cancer Father        skin mm  . Hypertension Father   . Migraines Sister   . COPD Maternal Grandmother   . COPD Paternal Grandfather   . Diabetes Paternal Grandfather   . Heart disease Paternal Grandfather     Social History:  reports that he has quit smoking. He has never used smokeless tobacco. He reports current alcohol use. He reports previous  drug use.  ROS: Please see flowsheet from today's date for complete review of systems.  Physical Exam: BP 126/81   Pulse 92   Ht 5\' 6"  (1.676 m)   Wt 200 lb (90.7 kg)   BMI 32.28 kg/m    Constitutional:  Alert and oriented, No acute distress. Cardiovascular: No clubbing, cyanosis, or edema. Respiratory: Normal respiratory effort, no increased work of breathing. GI: Abdomen is soft, nontender, nondistended, no abdominal masses GU: No CVA tenderness, phallus without lesions, widely patent meatus, testicles descended and 20 cc bilaterally, no masses or lesions, no tenderness to outpatient Lymph: No cervical or inguinal lymphadenopathy. Skin: No rashes, bruises or suspicious lesions. Neurologic: Grossly intact, no focal deficits, moving all 4 extremities. Psychiatric: Normal mood and affect.  Laboratory Data: Urinalysis today 0 RBCs, 0 WBCs, no bacteria  Pertinent Imaging: I have personally reviewed the CT abdomen pelvis and scrotal ultrasound, benign.  Assessment & Plan:   In summary the patient is a 33 year old healthy male with 8 months of intermittent right-sided thigh and scrotal pain.  There is no anatomic abnormality on CT scan or scrotal ultrasound.  Physical exam is benign.  His pain is primarily located on the inner right thigh.  We discussed pelvic and scrotal pain at length, and complex etiology.  I recommended a trial of scheduled anti-inflammatories for 3 weeks.  If he has persistent pain after this, would consider trial of gabapentin.  Call if pain refractory to scheduled course of NSAIDs, and we will trial gabapentin at that time  Sondra ComeBrian C Samanthan Dugo, MD  [facility] [address], [address] [place], [place] [phone]

## 2018-11-21 ENCOUNTER — Other Ambulatory Visit: Payer: Self-pay

## 2018-11-21 ENCOUNTER — Encounter: Payer: Self-pay | Admitting: Internal Medicine

## 2018-11-21 ENCOUNTER — Ambulatory Visit (INDEPENDENT_AMBULATORY_CARE_PROVIDER_SITE_OTHER): Payer: BLUE CROSS/BLUE SHIELD | Admitting: Internal Medicine

## 2018-11-21 DIAGNOSIS — G43909 Migraine, unspecified, not intractable, without status migrainosus: Secondary | ICD-10-CM | POA: Diagnosis not present

## 2018-11-21 DIAGNOSIS — K219 Gastro-esophageal reflux disease without esophagitis: Secondary | ICD-10-CM | POA: Diagnosis not present

## 2018-11-21 DIAGNOSIS — N50811 Right testicular pain: Secondary | ICD-10-CM | POA: Diagnosis not present

## 2018-11-21 DIAGNOSIS — Z1329 Encounter for screening for other suspected endocrine disorder: Secondary | ICD-10-CM

## 2018-11-21 DIAGNOSIS — Z1389 Encounter for screening for other disorder: Secondary | ICD-10-CM

## 2018-11-21 DIAGNOSIS — Z13220 Encounter for screening for lipoid disorders: Secondary | ICD-10-CM

## 2018-11-21 DIAGNOSIS — Z0000 Encounter for general adult medical examination without abnormal findings: Secondary | ICD-10-CM

## 2018-11-21 DIAGNOSIS — Z0184 Encounter for antibody response examination: Secondary | ICD-10-CM

## 2018-11-21 DIAGNOSIS — E559 Vitamin D deficiency, unspecified: Secondary | ICD-10-CM

## 2018-11-21 DIAGNOSIS — Z1159 Encounter for screening for other viral diseases: Secondary | ICD-10-CM

## 2018-11-21 MED ORDER — BUTALBITAL-APAP-CAFFEINE 50-325-40 MG PO TABS: 1.0000 | ORAL_TABLET | Freq: Three times a day (TID) | ORAL | 5 refills | Status: AC | PRN

## 2018-11-21 NOTE — Progress Notes (Signed)
Virtual Visit via Video Note  I connected with Barry Nolan   on 11/21/18 at  8:16 AM EDT by a video enabled telemedicine application and verified that I am speaking with the correct person using two identifiers.  Location patient: work  [employer]  Persons participating in the virtual visit: patient, provider  I discussed the limitations of evaluation and management by telemedicine and the availability of in person appointments. The patient expressed understanding and agreed to proceed.   HPI: 1. Migraines in the back of head when gets neck and back feel tense 6/10 Excedrine or ibuprofen typically control but at times he feels nauseated. No aura no triggers ? If trigger is stress he reduced caffeine sleeping 6-8 hrs at night. He tried a med in the past but cause abnormal sensation in his jaw. He does not want nausea medication  2. Right testicular pain came back 2 weeks ago but aleve helped and he took x 1 month he was given # back to Dr. Diamantina Providence again to disc gabapentin  3. GERD controlled   ROS: See pertinent positives and negatives per HPI.  Past Medical History:  Diagnosis Date  . Asthma    childhood with exercise   . Chicken pox   . Migraines   . Pain in right testicle   . Seizures ([place])   . Vitamin D deficiency     Past Surgical History:  Procedure Laterality Date  . WISDOM TOOTH EXTRACTION      Family History  Problem Relation Age of Onset  . Arthritis Mother   . Migraines Mother   . Cancer Father        skin mm  . Hypertension Father   . Migraines Sister   . COPD Maternal Grandmother   . COPD Paternal Grandfather   . Diabetes Paternal Grandfather   . Heart disease Paternal Grandfather     SOCIAL HX: married 53 month old baby   Current Outpatient Medications:  .  loratadine (CLARITIN) 10 MG tablet, Take 10 mg by mouth daily., Disp: , Rfl:  .  omeprazole (PRILOSEC) 10 MG capsule, Take 10 mg by mouth daily., Disp: , Rfl:  .  VITAMIN D PO, Take  by mouth daily., Disp: , Rfl:  .  butalbital-acetaminophen-caffeine (FIORICET) 50-325-40 MG tablet, Take 1-2 tablets by mouth 3 (three) times daily as needed for headache. No more than 6 pills in 24 hrs, Disp: 30 tablet, Rfl: 5  EXAM:  VITALS per patient if applicable:  GENERAL: alert, oriented, appears well and in no acute distress  HEENT: atraumatic, conjunttiva clear, no obvious abnormalities on inspection of external nose and ears  NECK: normal movements of the head and neck  LUNGS: on inspection no signs of respiratory distress, breathing rate appears normal, no obvious gross SOB, gasping or wheezing  CV: no obvious cyanosis  MS: moves all visible extremities without noticeable abnormality  PSYCH/NEURO: pleasant and cooperative, no obvious depression or anxiety, speech and thought processing grossly intact  ASSESSMENT AND PLAN:  Discussed the following assessment and plan:  Migraine without status migrainosus, not intractable, unspecified migraine type - Plan: butalbital-acetaminophen-caffeine (FIORICET) 50-325-40 MG tablet prn do not exceed 6 pills in 1 day  -disc otc magnesium  -hold imitrex for now may have had side effects   Gastroesophageal reflux disease, esophagitis presence not specified prn prilosec otc   Pain in right testicle-refer back to Dr. Diamantina Providence given # to call    HM physical due 05/18/19 or after before end  of year  Had flu shot 2019 Tdap utd  Hep B immune  MMR immune  No STD testing rec exercise to lose and healthy diet choices Can exfoliate and use amlactin for KP to body  Vitamin D taking 2000 IU otc   I discussed the assessment and treatment plan with the patient. The patient was provided an opportunity to ask questions and all were answered. The patient agreed with the plan and demonstrated an understanding of the instructions.   The patient was advised to call back or seek an in-person evaluation if the symptoms worsen or if the condition  fails to improve as anticipated.  Time spent 15 minutes  Delorise Jackson, MD

## 2018-12-03 IMAGING — US US SCROTUM W/ DOPPLER COMPLETE
1 series · 14 of 25 positions shown · non-contrast
Comparison: None.

CLINICAL DATA: Testicular pain.

EXAM:
SCROTAL ULTRASOUND
DOPPLER ULTRASOUND OF THE TESTICLES
TECHNIQUE: Complete ultrasound examination of the testicles, epididymis, and
other scrotal structures was performed. Color and spectral Doppler
ultrasound were also utilized to evaluate blood flow to the
testicles.

[Series 1: us scrotum w/ doppler complete · 0.07mm/px · 14 of 96 slices shown]
[im 1/96]
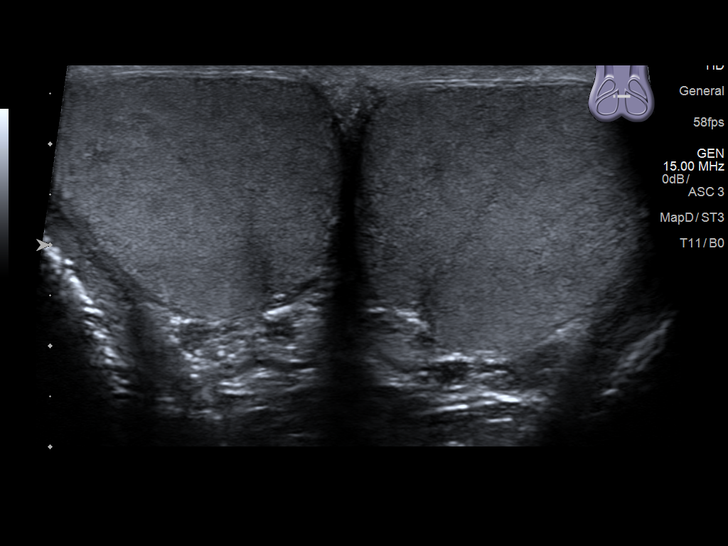
[im 8/96]
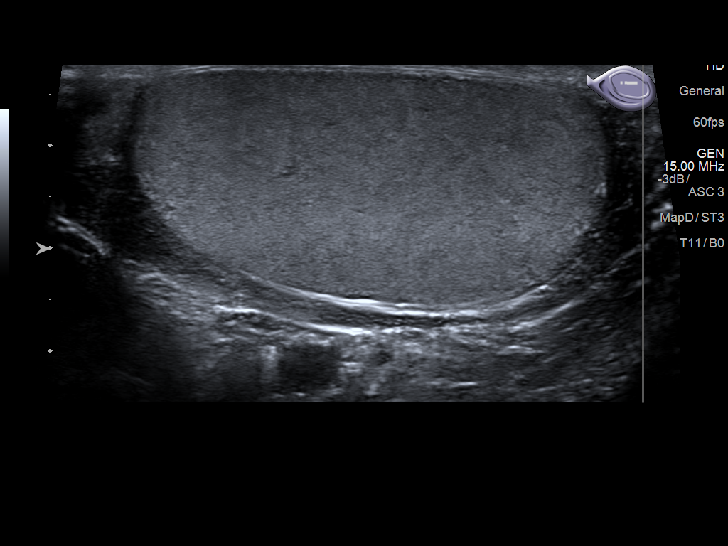
[im 16/96]
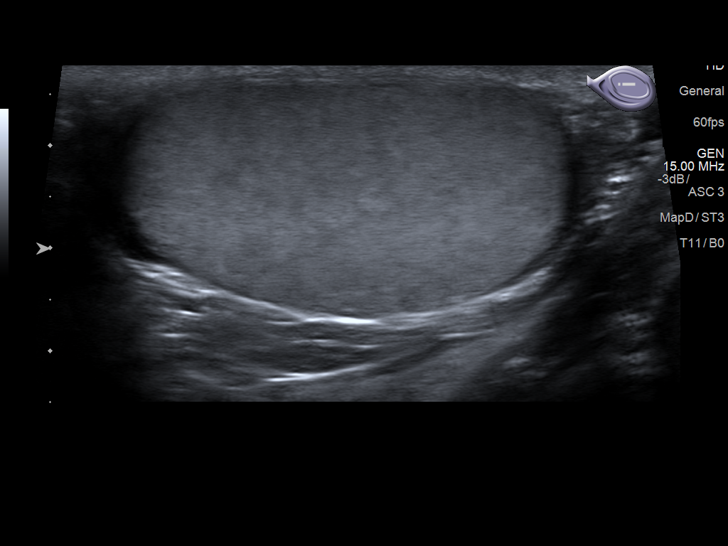
[im 24/96]
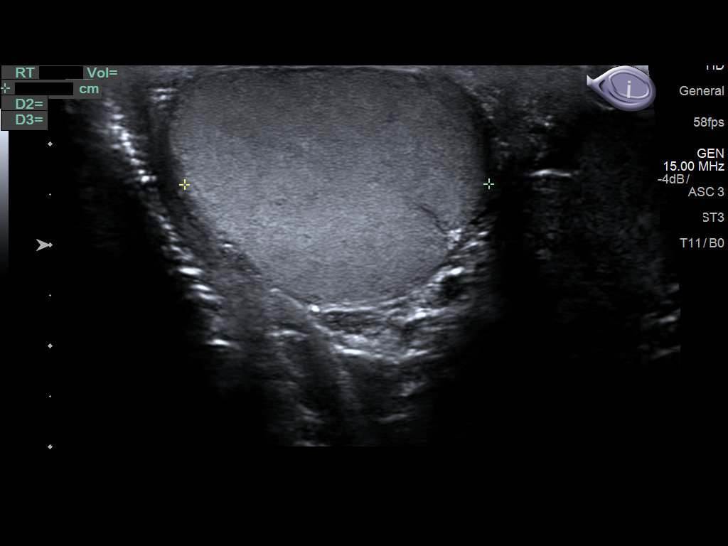
[im 32/96]
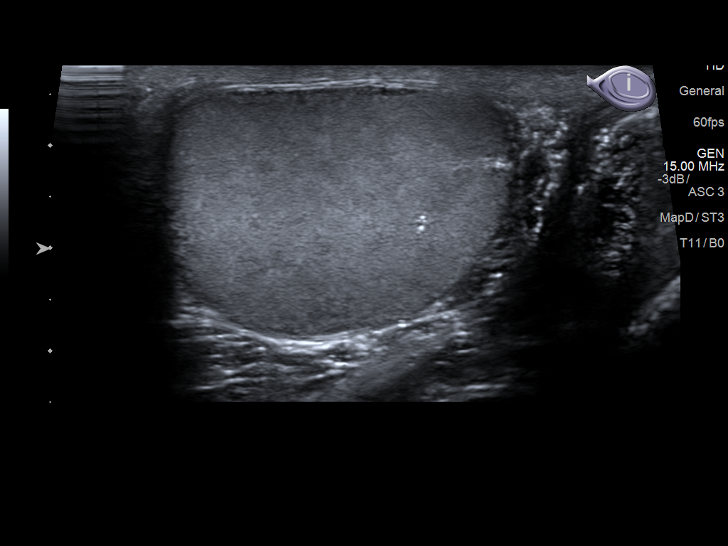
[im 36/96]
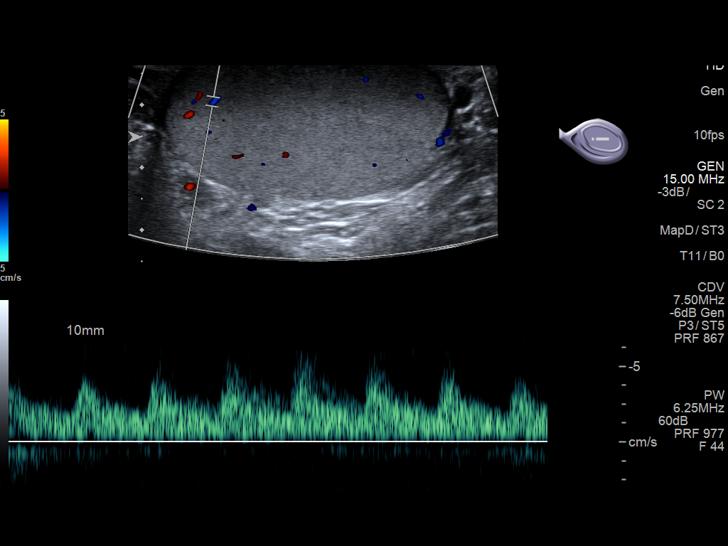
[im 44/96]
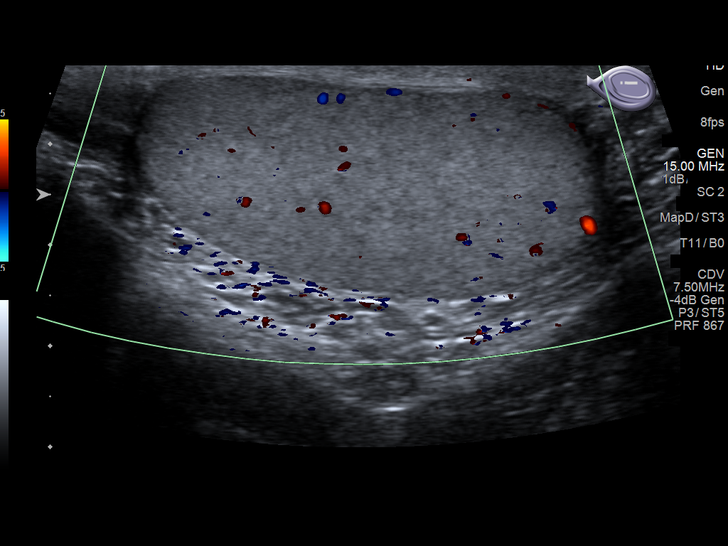
[im 52/96]
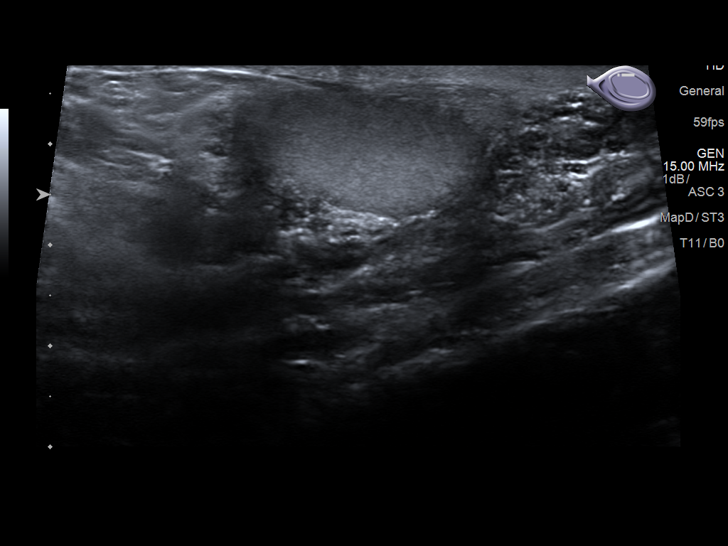
[im 60/96]
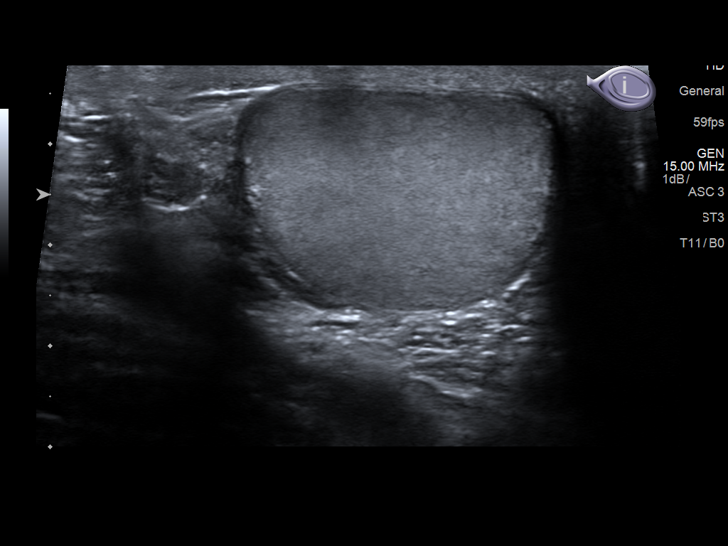
[im 64/96]
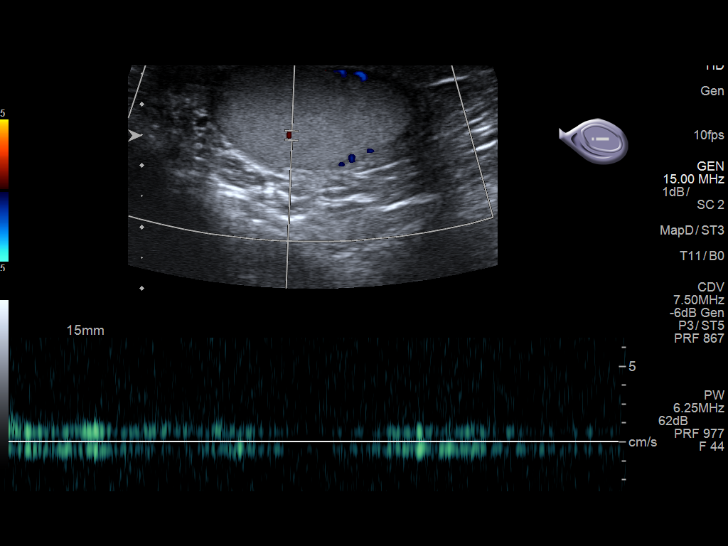
[im 72/96]
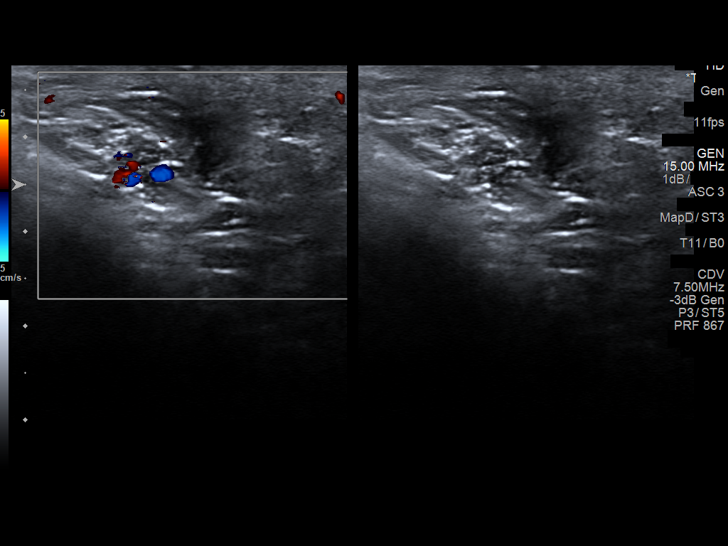
[im 80/96]
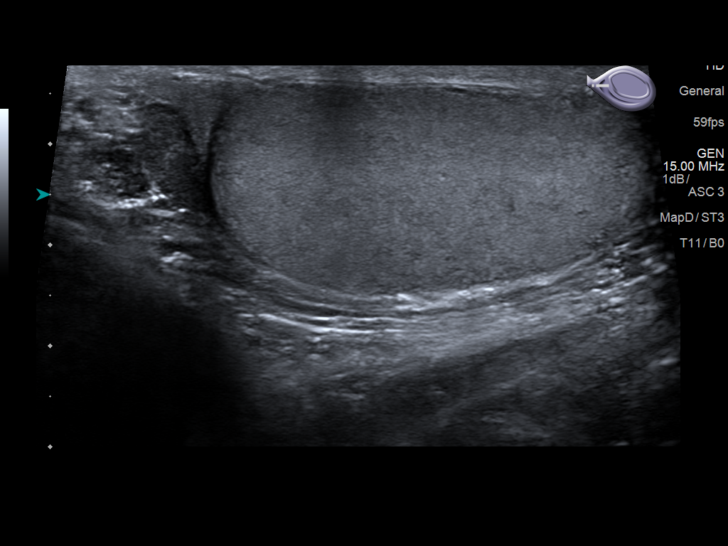
[im 88/96]
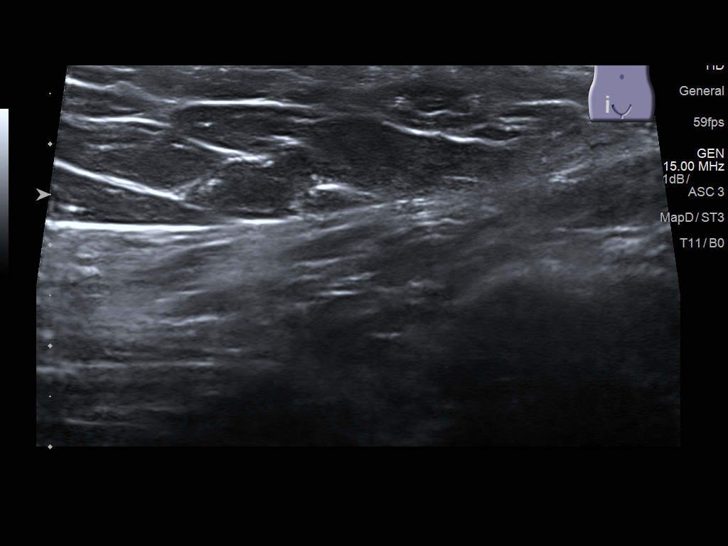
[im 96/96]
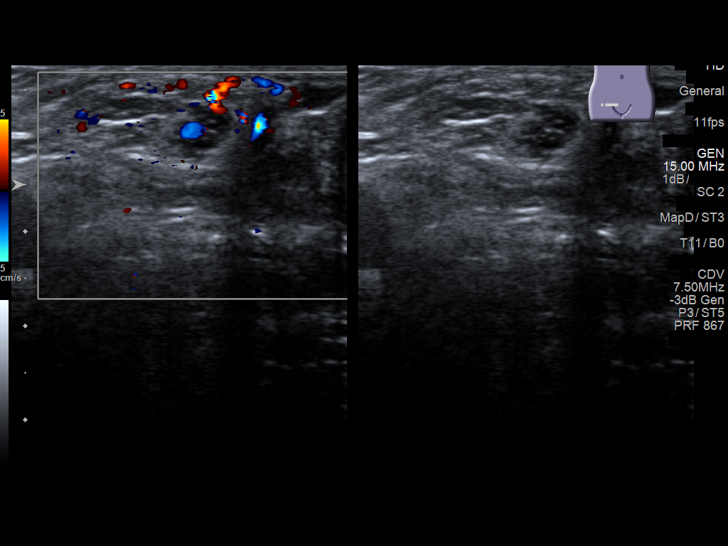

[14 of 25 positions shown; findings below may reference images not displayed]

FINDINGS: Right testicle

Measurements: 4.7 x 2.2 x 3.0 cm. No mass or microlithiasis
visualized.

Left testicle

Measurements: 4.9 x 2.0 x 3.1 cm. No mass or microlithiasis
visualized.

Right epididymis:  Normal in size and appearance.

Left epididymis:  Normal in size and appearance.

Hydrocele:  None visualized.

Varicocele:  None visualized.

Pulsed Doppler interrogation of both testes demonstrates normal low
resistance arterial and venous waveforms bilaterally.
IMPRESSION: No cause for testicular pain identified.  No torsion or mass.

## 2019-01-31 DIAGNOSIS — H6982 Other specified disorders of Eustachian tube, left ear: Secondary | ICD-10-CM | POA: Diagnosis not present

## 2019-04-20 DIAGNOSIS — H6982 Other specified disorders of Eustachian tube, left ear: Secondary | ICD-10-CM | POA: Diagnosis not present

## 2019-04-20 DIAGNOSIS — J301 Allergic rhinitis due to pollen: Secondary | ICD-10-CM | POA: Diagnosis not present

## 2019-04-20 DIAGNOSIS — M26622 Arthralgia of left temporomandibular joint: Secondary | ICD-10-CM | POA: Diagnosis not present

## 2019-05-05 DIAGNOSIS — Z20828 Contact with and (suspected) exposure to other viral communicable diseases: Secondary | ICD-10-CM | POA: Diagnosis not present

## 2019-05-21 ENCOUNTER — Other Ambulatory Visit (INDEPENDENT_AMBULATORY_CARE_PROVIDER_SITE_OTHER): Payer: BC Managed Care – PPO

## 2019-05-21 ENCOUNTER — Other Ambulatory Visit: Payer: Self-pay | Admitting: Internal Medicine

## 2019-05-21 ENCOUNTER — Other Ambulatory Visit: Payer: Self-pay

## 2019-05-21 DIAGNOSIS — Z0000 Encounter for general adult medical examination without abnormal findings: Secondary | ICD-10-CM | POA: Diagnosis not present

## 2019-05-21 DIAGNOSIS — Z1159 Encounter for screening for other viral diseases: Secondary | ICD-10-CM | POA: Diagnosis not present

## 2019-05-21 DIAGNOSIS — E559 Vitamin D deficiency, unspecified: Secondary | ICD-10-CM | POA: Diagnosis not present

## 2019-05-21 DIAGNOSIS — Z1389 Encounter for screening for other disorder: Secondary | ICD-10-CM

## 2019-05-21 DIAGNOSIS — Z1329 Encounter for screening for other suspected endocrine disorder: Secondary | ICD-10-CM

## 2019-05-21 DIAGNOSIS — Z13220 Encounter for screening for lipoid disorders: Secondary | ICD-10-CM

## 2019-05-21 DIAGNOSIS — D72819 Decreased white blood cell count, unspecified: Secondary | ICD-10-CM

## 2019-05-21 DIAGNOSIS — Z0184 Encounter for antibody response examination: Secondary | ICD-10-CM | POA: Diagnosis not present

## 2019-05-21 LAB — COMPREHENSIVE METABOLIC PANEL
ALT: 22 U/L (ref 0–53)
AST: 19 U/L (ref 0–37)
Albumin: 4.5 g/dL (ref 3.5–5.2)
Alkaline Phosphatase: 66 U/L (ref 39–117)
BUN: 14 mg/dL (ref 6–23)
CO2: 30 mEq/L (ref 19–32)
Calcium: 9.5 mg/dL (ref 8.4–10.5)
Chloride: 102 mEq/L (ref 96–112)
Creatinine, Ser: 1.02 mg/dL (ref 0.40–1.50)
GFR: 84.33 mL/min (ref >60.00)
Glucose, Bld: 96 mg/dL (ref 70–99)
Potassium: 4.2 mEq/L (ref 3.5–5.1)
Sodium: 138 mEq/L (ref 135–145)
Total Bilirubin: 0.7 mg/dL (ref 0.2–1.2)
Total Protein: 6.9 g/dL (ref 6.0–8.3)

## 2019-05-21 LAB — LIPID PANEL
Cholesterol: 199 mg/dL (ref 0–200)
HDL: 37.9 mg/dL — ABNORMAL LOW (ref >39.00)
LDL Cholesterol: 144 mg/dL — ABNORMAL HIGH (ref 0–99)
NonHDL: 160.71
Total CHOL/HDL Ratio: 5
Triglycerides: 84 mg/dL (ref 0.0–149.0)
VLDL: 16.8 mg/dL (ref 0.0–40.0)

## 2019-05-21 LAB — CBC WITH DIFFERENTIAL/PLATELET
Basophils Absolute: 0 10*3/uL (ref 0.0–0.1)
Basophils Relative: 0.8 % (ref 0.0–3.0)
Eosinophils Absolute: 0.1 10*3/uL (ref 0.0–0.7)
Eosinophils Relative: 2 % (ref 0.0–5.0)
HCT: 43.9 % (ref 39.0–52.0)
Hemoglobin: 14.9 g/dL (ref 13.0–17.0)
Lymphocytes Relative: 36.2 % (ref 12.0–46.0)
Lymphs Abs: 1.4 10*3/uL (ref 0.7–4.0)
MCHC: 34 g/dL (ref 30.0–36.0)
MCV: 86.2 fl (ref 78.0–100.0)
Monocytes Absolute: 0.3 10*3/uL (ref 0.1–1.0)
Monocytes Relative: 8.2 % (ref 3.0–12.0)
Neutro Abs: 2.1 10*3/uL (ref 1.4–7.7)
Neutrophils Relative %: 52.8 % (ref 43.0–77.0)
Platelets: 251 10*3/uL (ref 150.0–400.0)
RBC: 5.09 Mil/uL (ref 4.22–5.81)
RDW: 13 % (ref 11.5–15.5)
WBC: 3.9 10*3/uL — ABNORMAL LOW (ref 4.0–10.5)

## 2019-05-21 LAB — T4, FREE: Free T4: 0.84 ng/dL (ref 0.60–1.60)

## 2019-05-21 LAB — VITAMIN D 25 HYDROXY (VIT D DEFICIENCY, FRACTURES): VITD: 50.97 ng/mL (ref 30.00–100.00)

## 2019-05-21 LAB — TSH: TSH: 1.36 u[IU]/mL (ref 0.35–4.50)

## 2019-05-22 LAB — URINALYSIS, ROUTINE W REFLEX MICROSCOPIC
Bilirubin Urine: NEGATIVE
Glucose, UA: NEGATIVE
Hgb urine dipstick: NEGATIVE
Ketones, ur: NEGATIVE
Leukocytes,Ua: NEGATIVE
Nitrite: NEGATIVE
Protein, ur: NEGATIVE
Specific Gravity, Urine: 1.004 (ref 1.001–1.03)
pH: 7 (ref 5.0–8.0)

## 2019-05-22 LAB — HEPATITIS B SURFACE ANTIBODY, QUANTITATIVE: Hepatitis B-Post: 343 m[IU]/mL (ref >=10)

## 2019-05-30 ENCOUNTER — Encounter: Payer: BLUE CROSS/BLUE SHIELD | Admitting: Internal Medicine

## 2019-06-11 DIAGNOSIS — H9202 Otalgia, left ear: Secondary | ICD-10-CM | POA: Diagnosis not present

## 2019-06-11 DIAGNOSIS — J019 Acute sinusitis, unspecified: Secondary | ICD-10-CM | POA: Diagnosis not present

## 2019-06-11 DIAGNOSIS — H6982 Other specified disorders of Eustachian tube, left ear: Secondary | ICD-10-CM | POA: Diagnosis not present

## 2019-08-24 DIAGNOSIS — Z20828 Contact with and (suspected) exposure to other viral communicable diseases: Secondary | ICD-10-CM | POA: Diagnosis not present

## 2019-08-24 DIAGNOSIS — Z03818 Encounter for observation for suspected exposure to other biological agents ruled out: Secondary | ICD-10-CM | POA: Diagnosis not present

## 2019-11-18 IMAGING — CT CT ABD-PELV W/ CM
2 of 4 series · 16 of 46 positions shown, 18 images · IV contrast (iopamidol)
Comparison: None.

CLINICAL DATA: Right lower quadrant pain radiating into the groin
and right testicle for 2-3 months.

EXAM:
CT ABDOMEN AND PELVIS WITH CONTRAST
TECHNIQUE: Multidetector CT imaging of the abdomen and pelvis was performed
using the standard protocol following bolus administration of
intravenous contrast.
CONTRAST:  100mL [id number] IOPAMIDOL ([id number]) INJECTION 61%

[Series 2: abd pelvis · axial · 0.72mm/px · z∈[-1597,-1137]mm · 13 of 100 slices shown, 15 images (1 of 2)]
[im 4/100  soft-tissue]
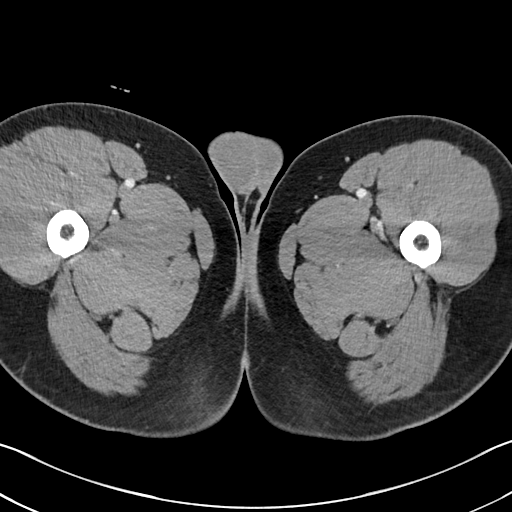
[im 4/100  bone]
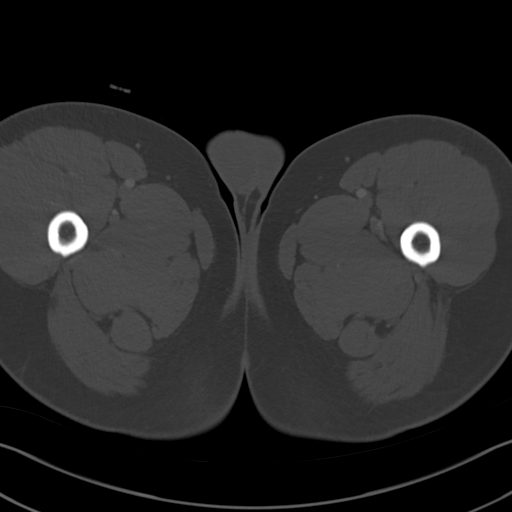
[im 12/100  soft-tissue]
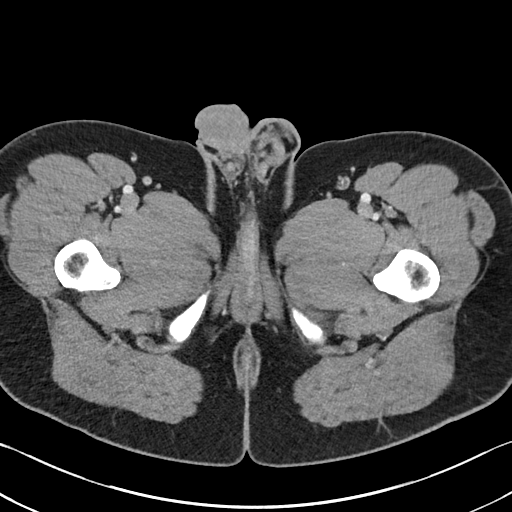
[im 20/100  soft-tissue]
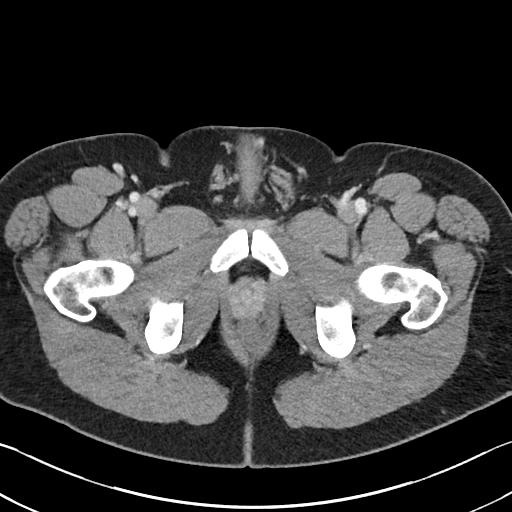
[im 28/100  soft-tissue]
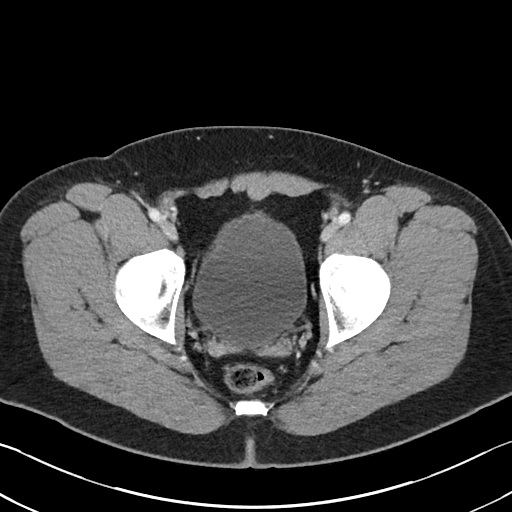
[im 36/100  soft-tissue]
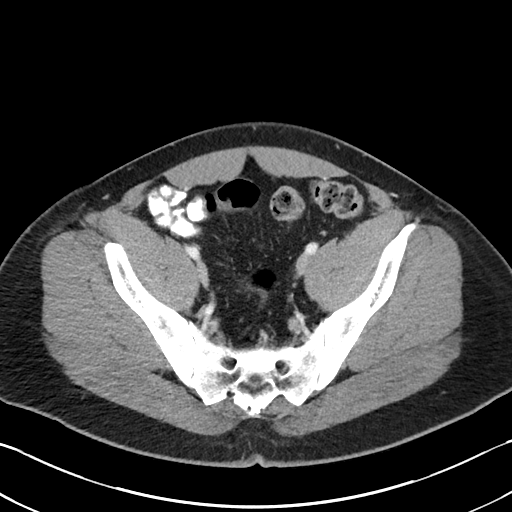
[im 44/100  soft-tissue]
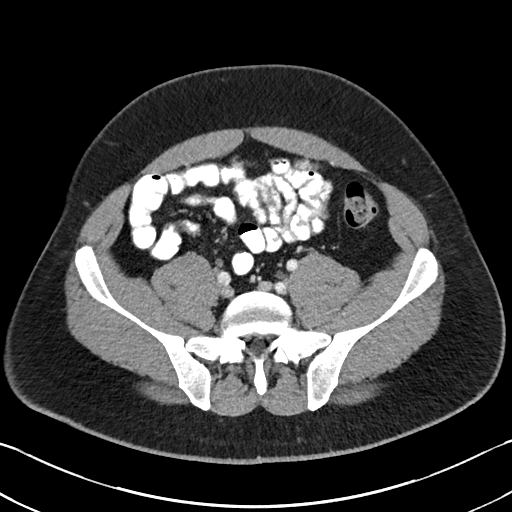
[im 52/100  soft-tissue]
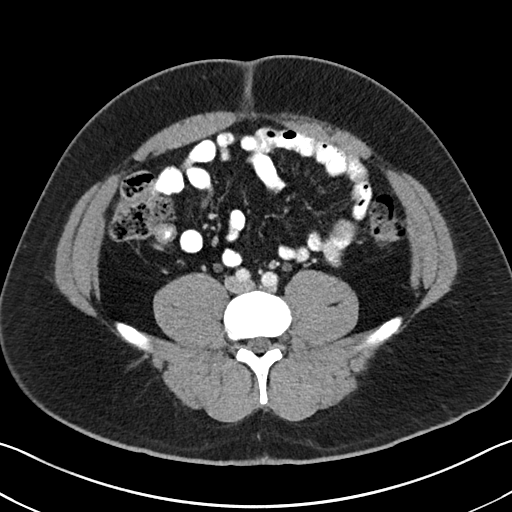
[im 56/100  soft-tissue]
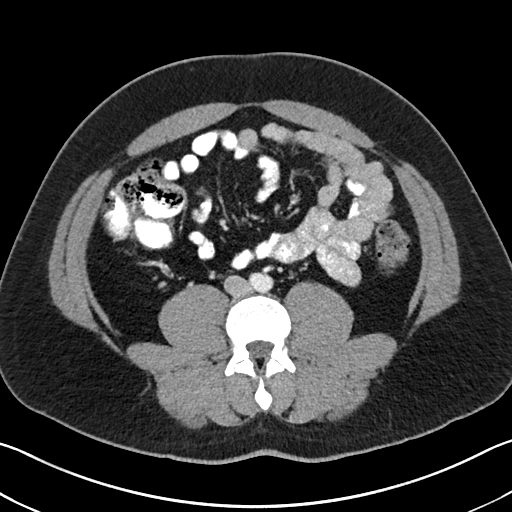
[im 64/100  soft-tissue]
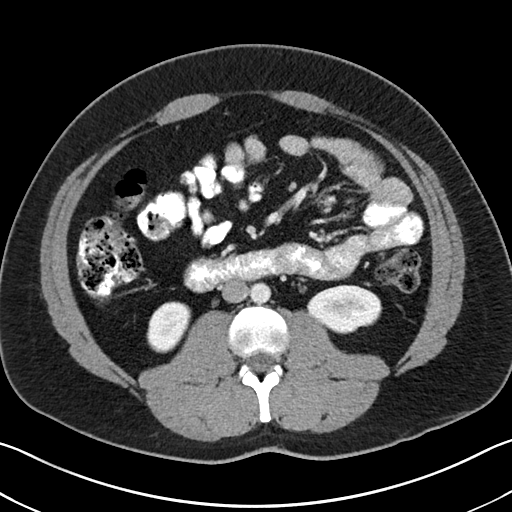
[im 64/100  bone]
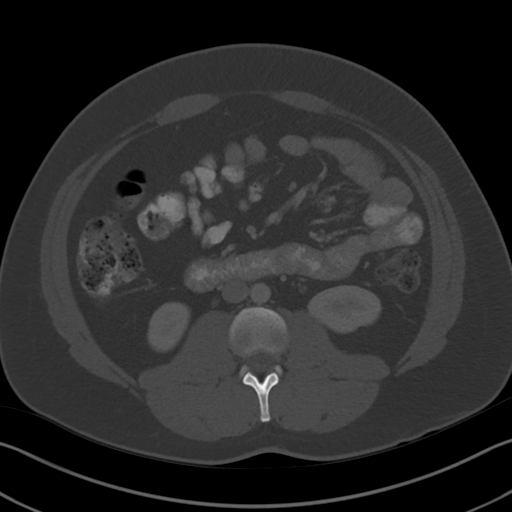
[im 72/100  soft-tissue]
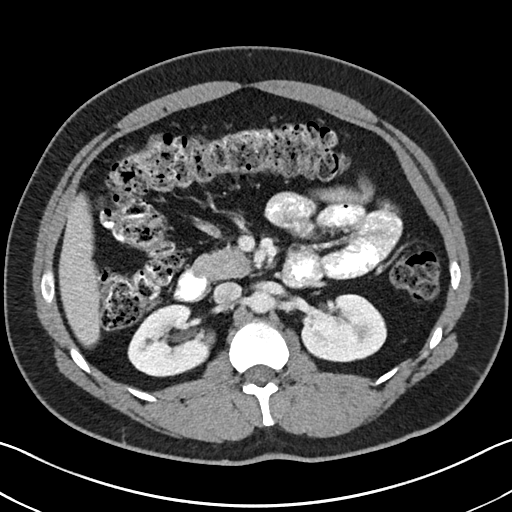
[im 80/100  soft-tissue]
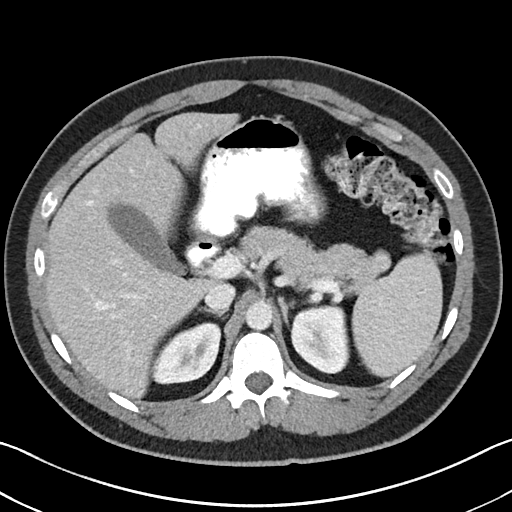
[im 88/100  soft-tissue]
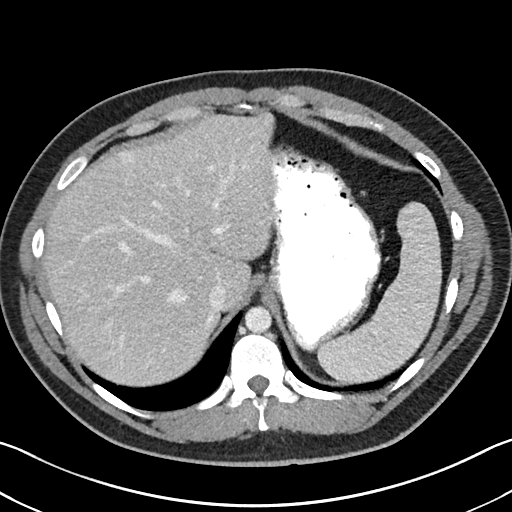
[im 96/100  soft-tissue]
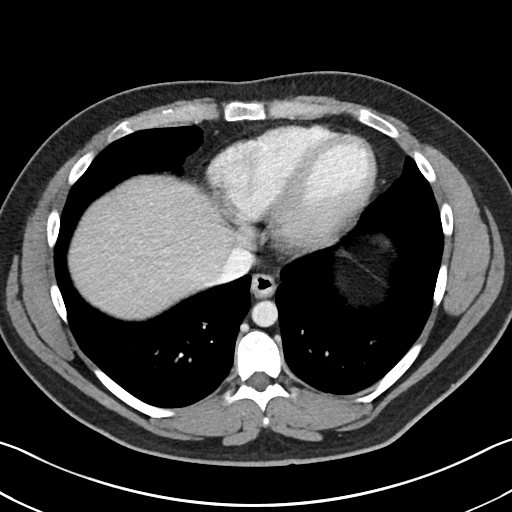

[Series 4: abd pelvis · coronal · 0.73mm/px · 3 of 153 slices shown (2 of 2)]
[im 51/153  soft-tissue]
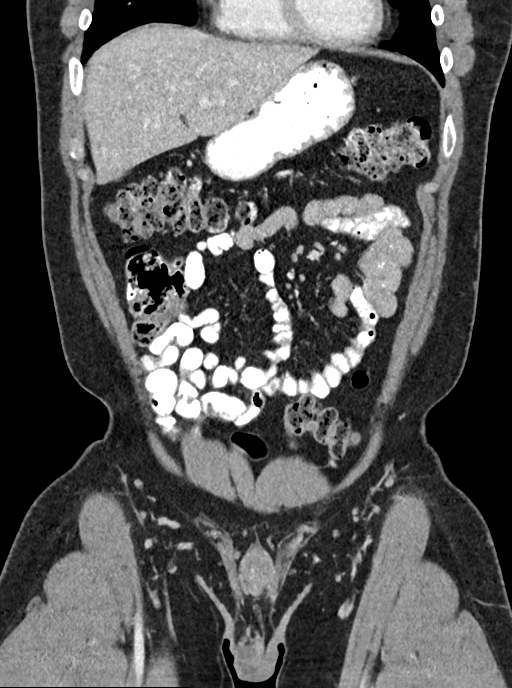
[im 68/153  soft-tissue]
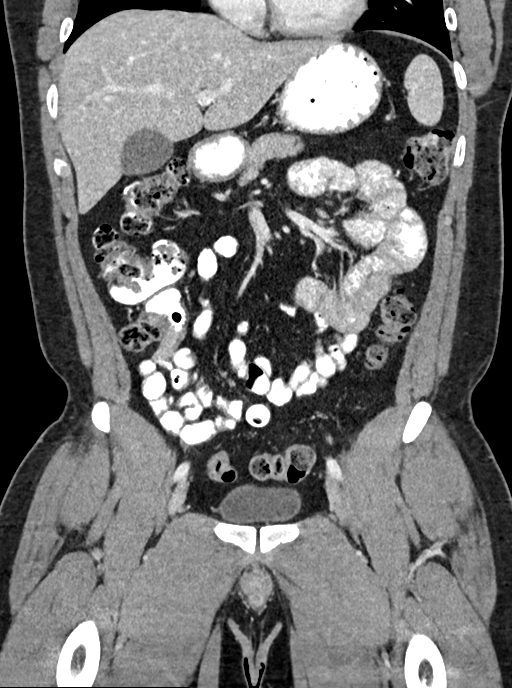
[im 85/153  soft-tissue]
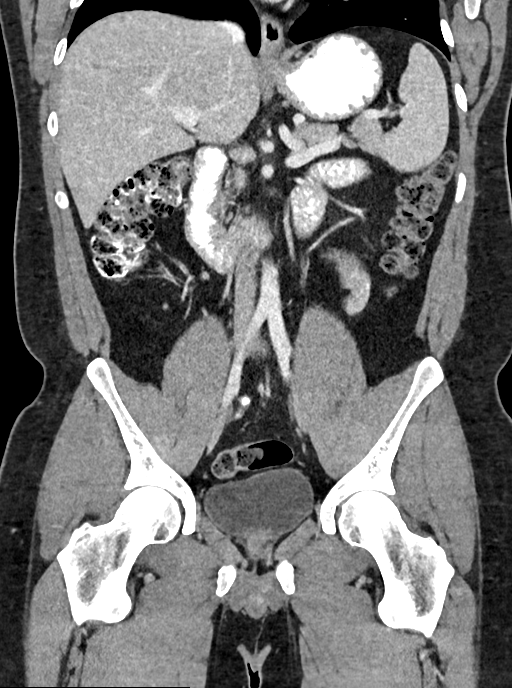

[16 of 46 positions shown; findings below may reference images not displayed]

FINDINGS: Lower chest: Normal.

Hepatobiliary: There is an 11 mm area of increased enhancement of
the periphery of the right lobe of the liver on image 13 of series
2. This could represent a small hemangioma or a transient hepatic
attenuation difference. Liver parenchyma is otherwise normal.
Biliary tree is normal.

Pancreas: Unremarkable. No pancreatic ductal dilatation or
surrounding inflammatory changes.

Spleen: Normal in size without focal abnormality.

Adrenals/Urinary Tract: Adrenal glands are unremarkable. Kidneys are
normal, without renal calculi, focal lesion, or hydronephrosis.
Bladder is unremarkable.

Stomach/Bowel: Stomach is within normal limits. Appendix appears
normal. No evidence of bowel wall thickening, distention, or
inflammatory changes. There is a focal area of what is most likely
contraction in the terminal ileum. I do not think this represents
abnormal bowel wall thickening. There is no proximal dilatation.

Vascular/Lymphatic: No significant vascular findings are present. No
enlarged abdominal or pelvic lymph nodes.

Reproductive: Prostate is unremarkable.

Other: There is no hernia or mass or other significant abnormality.
No free air or free fluid in the abdomen.

Musculoskeletal: Normal. Specifically, the region of the right
inguinal canal is normal.
IMPRESSION: 1. No significant abnormality of the abdomen or pelvis.
2. Small focal area of slight increased enhancement in the right
lobe of the liver, probably a transient hepatic attenuation
difference or hemangioma.

## 2021-01-07 ENCOUNTER — Other Ambulatory Visit: Payer: Self-pay

## 2021-01-07 ENCOUNTER — Ambulatory Visit
Admission: EM | Admit: 2021-01-07 | Discharge: 2021-01-07 | Disposition: A | Discharge status: Home / Self Care | Payer: No Typology Code available for payment source | Attending: Internal Medicine | Admitting: Internal Medicine

## 2021-01-07 DIAGNOSIS — Z1152 Encounter for screening for COVID-19: Secondary | ICD-10-CM | POA: Diagnosis not present

## 2021-01-07 DIAGNOSIS — H6692 Otitis media, unspecified, left ear: Secondary | ICD-10-CM | POA: Diagnosis not present

## 2021-01-07 DIAGNOSIS — R0981 Nasal congestion: Secondary | ICD-10-CM

## 2021-01-07 MED ORDER — SUDAFED SINUS CONGESTION 24HR 240 MG PO TB24: ORAL_TABLET | ORAL | 0 refills | Status: AC

## 2021-01-07 MED ORDER — AMOXICILLIN-POT CLAVULANATE 875-125 MG PO TABS: 1.0000 | ORAL_TABLET | Freq: Two times a day (BID) | ORAL | 0 refills | Status: AC

## 2021-01-07 NOTE — ED Notes (Signed)
Covid test not sent per the provider

## 2021-01-07 NOTE — ED Provider Notes (Signed)
Barry Nolan    CSN: [id number] Arrival date & time: 01/07/21  0802      History   Chief Complaint Chief Complaint  Patient presents with   Nasal Congestion    HPI Barry Nolan is a 35 y.o. male who presents with L ear pain x 3 days and got worse last night. Has been dealing with nose congestion x 2 weeks. Has had 2 in home covid test which have been negative. Denies fever. HA, body aches. Has a mild cough.  Has been using flonase   Past Medical History:  Diagnosis Date   Asthma    childhood with exercise    Chicken pox    Migraines    Pain in right testicle    Seizures (HCC)    Vitamin D deficiency     Patient Active Problem List   Diagnosis Date Noted   Allergic rhinitis 07/18/2018   Insomnia 07/18/2018   Annual physical exam 05/16/2018   Keratosis pilaris 05/16/2018   Vitamin D deficiency 05/16/2018   Pain in right testicle 05/16/2018   Obesity (BMI 30-39.9) 05/16/2018   History of seizure 12/15/2017   Acute pain of left knee 12/15/2017   Migraine without aura and without status migrainosus, not intractable 12/15/2017   Migraines 07/15/2008   Esophageal reflux 01/04/2006    Past Surgical History:  Procedure Laterality Date   WISDOM TOOTH EXTRACTION         Home Medications    Prior to Admission medications   Medication Sig Start Date End Date Taking? Authorizing Provider  amoxicillin-clavulanate (AUGMENTIN) 875-125 MG tablet Take 1 tablet by mouth every 12 (twelve) hours. 01/07/21  Yes Rodriguez-Southworth, Nettie Elm, PA-C  Pseudoephedrine HCl (SUDAFED SINUS CONGESTION 24HR) 240 MG TB24 One q am 01/07/21  Yes Rodriguez-Southworth, Nettie Elm, PA-C  loratadine (CLARITIN) 10 MG tablet Take 10 mg by mouth daily.    [provider]  omeprazole (PRILOSEC) 10 MG capsule Take 10 mg by mouth daily.    [provider]  VITAMIN D PO Take by mouth daily.    [provider]    Family History Family History  Problem Relation Age  of Onset   Arthritis Mother    Migraines Mother    Cancer Father        skin mm   Hypertension Father    Migraines Sister    COPD Maternal Grandmother    COPD Paternal Grandfather    Diabetes Paternal Grandfather    Heart disease Paternal Grandfather     Social History Social History   Tobacco Use   Smoking status: Former   Smokeless tobacco: Never   Tobacco comments:    age [age] 1pk lasted 4-5 days   Substance Use Topics   Alcohol use: Yes   Drug use: Not Currently     Allergies   Pertussis vaccines and Tape   Review of Systems Review of Systems  HENT:  Positive for congestion.   Respiratory:  Positive for cough.   The rest is neg.   Physical Exam Triage Vital Signs ED Triage Vitals [01/07/21 0817]  Enc Vitals Group     BP 130/75     Pulse Rate 79     Resp 16     Temp 98.2 F (36.8 C)     Temp Source Oral     SpO2 98 %     Weight 183 lb (83 kg)     Height 5\' 6"  (1.676 m)     Head Circumference  Peak Flow      Pain Score 6     Pain Loc      Pain Edu?      Excl. in GC?    No data found.  Updated Vital Signs BP 130/75   Pulse 79   Temp 98.2 F (36.8 C) (Oral)   Resp 16   Ht 5\' 6"  (1.676 m)   Wt 183 lb (83 kg)   SpO2 98%   BMI 29.54 kg/m   Visual Acuity Right Eye Distance:   Left Eye Distance:   Bilateral Distance:    Right Eye Near:   Left Eye Near:    Bilateral Near:     Physical Exam Physical Exam Vitals signs and nursing note reviewed.  Constitutional:      General: He is not in acute distress.    Appearance: Normal appearance. He is not ill-appearing, toxic-appearing or diaphoretic.  HENT:     Head: Normocephalic.     Right Ear: Tympanic membrane, ear canal and external ear normal.     Left Ear: Tympanic membrane is partly bulging and erythematous, but ear canal and external ear normal.     Nose: Nose normal.     Mouth/Throat: clear    Mouth: Mucous membranes are moist.  Eyes:     General: No scleral icterus.        Right eye: No discharge.        Left eye: No discharge.     Conjunctiva/sclera: Conjunctivae normal.  Neck:     Musculoskeletal: Neck supple. No neck rigidity.  Cardiovascular:     Rate and Rhythm: Normal rate and regular rhythm.     Heart sounds: No murmur.  Pulmonary:     Effort: Pulmonary effort is normal.     Breath sounds: Normal breath sounds.   Musculoskeletal: Normal range of motion.  Lymphadenopathy:     Cervical: No cervical adenopathy.  Skin:    General: Skin is warm and dry.     Coloration: Skin is not jaundiced.     Findings: No rash.  Neurological:     Mental Status: He is alert and oriented to person, place, and time.     Gait: Gait normal.  Psychiatric:        Mood and Affect: Mood normal.        Behavior: Behavior normal.        Thought Content: Thought content normal.        Judgment: Judgment normal.    UC Treatments / Results  Labs (all labs ordered are listed, but only abnormal results are displayed) Labs Reviewed  NOVEL CORONAVIRUS, NAA    EKG   Radiology No results found.  Procedures Procedures (including critical care time)  Medications Ordered in UC Medications - No data to display  Initial Impression / Assessment and Plan / UC Course  I have reviewed the triage vital signs and the nursing notes. Has L OM and nose congestion. He does not have acute covid symptoms, so I cancelled the covid test which pt was in agreement with. I placed him on Augmentin and sudafed. See instructions Final Clinical Impressions(s) / UC Diagnoses   Final diagnoses:  Encounter for screening for COVID-19  Acute otitis media, left     Discharge Instructions      Continue using the Flonase 2 sprays at a time for 7 more days You may continue with the Ibuprofen up to 800 mg with food every 8 hours for pain  ED Prescriptions     Medication Sig Dispense Auth. Provider   Pseudoephedrine HCl (SUDAFED SINUS CONGESTION 24HR) 240 MG TB24 One q am 10 tablet  Rodriguez-Southworth, Rondia Higginbotham, PA-C   amoxicillin-clavulanate (AUGMENTIN) 875-125 MG tablet Take 1 tablet by mouth every 12 (twelve) hours. 14 tablet Rodriguez-Southworth, Nettie Elm, PA-C      PDMP not reviewed this encounter.   Garey Ham, PA-C 01/07/21 1110

## 2021-01-07 NOTE — ED Triage Notes (Signed)
Pt reports having nasal congestion x2 weeks and L ear pain x2 days. Denies having fever. 2 neg home covid test within a week.

## 2021-01-07 NOTE — Discharge Instructions (Addendum)
Continue using the Flonase 2 sprays at a time for 7 more days You may continue with the Ibuprofen up to 800 mg with food every 8 hours for pain

## 2021-01-15 ENCOUNTER — Ambulatory Visit
Admission: EM | Admit: 2021-01-15 | Discharge: 2021-01-15 | Disposition: A | Discharge status: Home / Self Care | Payer: No Typology Code available for payment source | Attending: Emergency Medicine | Admitting: Emergency Medicine

## 2021-01-15 ENCOUNTER — Encounter: Payer: Self-pay | Admitting: Emergency Medicine

## 2021-01-15 DIAGNOSIS — H6692 Otitis media, unspecified, left ear: Secondary | ICD-10-CM | POA: Diagnosis not present

## 2021-01-15 MED ORDER — AZELASTINE HCL 0.1 % NA SOLN: 2.0000 | Freq: Two times a day (BID) | NASAL | 0 refills | Status: AC

## 2021-01-15 MED ORDER — PREDNISONE 10 MG PO TABS: ORAL_TABLET | ORAL | 0 refills | Status: AC

## 2021-01-15 NOTE — ED Provider Notes (Signed)
Chief Complaint   Chief Complaint  Patient presents with   Otalgia     Subjective, HPI  Barry Nolan is a very pleasant 35 y.o. male who presents with left ear pain.  Patient recently finished a course of Augmentin, but states that his ear has not gotten better.  He was diagnosed with left otitis media.  He reports the use of Flonase without improvement.  He does not report any fever, chills, vomiting or additional symptoms today.  Patient's problem list, past medical and social history, medications, and allergies were reviewed by me and updated in Epic.   ROS  See HPI.  Objective   Vitals:   01/15/21 0808  BP: 125/82  Pulse: 67  Resp: 20  Temp: 98.1 F (36.7 C)  SpO2: 98%     General: Appears well-developed and well-nourished. No acute distress.  HEENT Head: Normocephalic and atraumatic.  Eyes: Conjunctivae and EOM are normal. No eye drainage or scleral icterus bilaterally.  Ears: Bilateral: Hearing grossly intact. No drainage or visible deformity. No mastoid erythema, edema, or tenderness.  Right: WNL Left: TM erythematous without bulging.  No perforation.  No purulence. Nose: No nasal deviation.  Mouth/Throat: No stridor or tracheal deviation.  Neck: Normal range of motion, neck is supple.  Cardiovascular: Normal rate Pulm/Chest: No respiratory distress. No accessory muscle usage, speaking in full sentences.  Skin: Skin is warm and dry.    Vital signs and nursing note reviewed.    Assessment & Plan  @DIAGMED @  Meds ordered this encounter  Medications   predniSONE (DELTASONE) 10 MG tablet    Sig: Take 3 tablets (30 mg total) by mouth daily with breakfast for 2 days, THEN 2 tablets (20 mg total) daily with breakfast for 2 days, THEN 1 tablet (10 mg total) daily with breakfast for 2 days.    Dispense:  12 tablet    Refill:  0    Order Specific Question:   Supervising Provider    Answer:   Merrilee Jansky   azelastine (ASTELIN) 0.1 % nasal spray     Sig: Place 2 sprays into both nostrils 2 (two) times daily for 7 days. Use in each nostril as directed    Dispense:  30 mL    Refill:  0    Order Specific Question:   Supervising Provider    Answer:   [[id number]] Merrilee Jansky    35 y.o. male presents with left ear pain.  Patient recently finished a course of Augmentin, but states that his ear has not gotten better.  He was diagnosed with left otitis media.  He reports the use of Flonase without improvement.  He does not report any fever, chills, vomiting or additional symptoms today.  Review completed.  Given symptoms along with assessment findings, likely continued inflammation to the left TM.  Advised the patient that I am not concerned for continued underlying infection since he did recently finish a 7-day course of Augmentin, but do feel that the TM is inflamed.  Rx low-dose steroid taper along with Astelin nasal spray to use to help with discomfort and irritation.  Advised about home treatment and care as outlined in AVS.  Return as needed.  Stable on discharge.  Advised that he may need to follow-up with his PCP within the next 1 week if symptoms do not improve as he may have to be referred to ENT to have the left ear evaluated.  Plan:   Discharge Instructions  Follow-up with PCP within the next 1 week if symptoms do not improve.  You may use Tylenol or ibuprofen as needed for fever or pain as long as neither of these medications are contraindicated to any current health conditions. Alternate warm and cool compresses to left ear to help with discomfort. Resting will help the body to fight infection.  Ensure that you receive adequate rest and aim for 8 hours of sleep nightly while recovering from illness.  If you begin to notice worsening of symptoms such as new high fever, worsening ear pain, redness or swelling behind your ear, new or worse discharge from your ear, difficulty hearing or if you are not beginning to feel better  after 2 to 3 days return to clinic for further evaluation.  If you begin to notice dizziness, severe headache or confusion go to the ER for evaluation.         Amalia Greenhouse, FNP-C 01/15/21  This note was partially made with the aid of speech-to-text dictation; typographical errors are not intentional.    Amalia Greenhouse, [facility] 01/15/21 [phone]

## 2021-01-15 NOTE — ED Triage Notes (Signed)
Pt was seen for left ear pain last week and was given abx. There has been no change and is here for follow-up

## 2021-01-15 NOTE — Discharge Instructions (Addendum)
Follow-up with PCP within the next 1 week if symptoms do not improve.  You may use Tylenol or ibuprofen as needed for fever or pain as long as neither of these medications are contraindicated to any current health conditions. Alternate warm and cool compresses to left ear to help with discomfort. Resting will help the body to fight infection.  Ensure that you receive adequate rest and aim for 8 hours of sleep nightly while recovering from illness.  If you begin to notice worsening of symptoms such as new high fever, worsening ear pain, redness or swelling behind your ear, new or worse discharge from your ear, difficulty hearing or if you are not beginning to feel better after 2 to 3 days return to clinic for further evaluation.  If you begin to notice dizziness, severe headache or confusion go to the ER for evaluation.

## 2022-10-13 ENCOUNTER — Ambulatory Visit
Admission: RE | Admit: 2022-10-13 | Discharge: 2022-10-13 | Disposition: A | Discharge status: Home / Self Care | Payer: BC Managed Care – PPO | Source: Ambulatory Visit | Attending: Emergency Medicine | Admitting: Emergency Medicine

## 2022-10-13 VITALS — BP 124/81 | HR 78 | Temp 97.7°F | Resp 18

## 2022-10-13 DIAGNOSIS — Z20818 Contact with and (suspected) exposure to other bacterial communicable diseases: Secondary | ICD-10-CM | POA: Diagnosis not present

## 2022-10-13 DIAGNOSIS — J029 Acute pharyngitis, unspecified: Secondary | ICD-10-CM

## 2022-10-13 LAB — POCT RAPID STREP A (OFFICE): Rapid Strep A Screen: NEGATIVE

## 2022-10-13 NOTE — ED Provider Notes (Signed)
Barry Nolan    CSN: [id number] Arrival date & time: 10/13/22  1416      History   Chief Complaint Chief Complaint  Patient presents with   Sore Throat    Steep test needed. Other family members tested positive. - Entered by patient    HPI Barry Nolan is a 37 y.o. male.  Patient presents with sore throat x 2 days.  No fever, rash, cough, shortness of breath, or other symptoms.  No OTC medications today.  He reports he had strep throat 2 months ago and was treated with Augmentin; then he had strep again 1 month ago and was treated with cefdinir.  His wife and children are currently strep positive and have had recurrent strep in the last 2 months.  His medical history includes asthma, allergies, seizures, migraine headache, insomnia, obesity.  The history is provided by the patient and medical records.    Past Medical History:  Diagnosis Date   Asthma    childhood with exercise    Chicken pox    Migraines    Pain in right testicle    Seizures    Vitamin D deficiency     Patient Active Problem List   Diagnosis Date Noted   Allergic rhinitis 07/18/2018   Insomnia 07/18/2018   Annual physical exam 05/16/2018   Keratosis pilaris 05/16/2018   Vitamin D deficiency 05/16/2018   Pain in right testicle 05/16/2018   Obesity (BMI 30-39.9) 05/16/2018   History of seizure 12/15/2017   Acute pain of left knee 12/15/2017   Migraine without aura and without status migrainosus, not intractable 12/15/2017   Migraines 07/15/2008   Esophageal reflux 01/04/2006    Past Surgical History:  Procedure Laterality Date   WISDOM TOOTH EXTRACTION         Home Medications    Prior to Admission medications   Medication Sig Start Date End Date Taking? Authorizing Provider  amoxicillin-clavulanate (AUGMENTIN) 875-125 MG tablet Take 1 tablet by mouth every 12 (twelve) hours. Patient not taking: Reported on 10/13/2022 01/07/21   Rodriguez-Southworth, Nettie Elm, PA-C  azelastine  (ASTELIN) 0.1 % nasal spray Place 2 sprays into both nostrils 2 (two) times daily for 7 days. Use in each nostril as directed 01/15/21 01/22/21  Boddu, Belenda Cruise, FNP  loratadine (CLARITIN) 10 MG tablet Take 10 mg by mouth daily.    [provider]  omeprazole (PRILOSEC) 10 MG capsule Take 10 mg by mouth daily.    [provider]  Pseudoephedrine HCl (SUDAFED SINUS CONGESTION 24HR) 240 MG TB24 One q am Patient not taking: Reported on 10/13/2022 01/07/21   Rodriguez-Southworth, Nettie Elm, PA-C  VITAMIN D PO Take by mouth daily.    [provider]    Family History Family History  Problem Relation Age of Onset   Arthritis Mother    Migraines Mother    Cancer Father        skin mm   Hypertension Father    Migraines Sister    COPD Maternal Grandmother    COPD Paternal Grandfather    Diabetes Paternal Grandfather    Heart disease Paternal Grandfather     Social History Social History   Tobacco Use   Smoking status: Former   Smokeless tobacco: Never   Tobacco comments:    age [age] 1pk lasted 4-5 days   Substance Use Topics   Alcohol use: Yes   Drug use: Not Currently     Allergies   Pertussis vaccines and Tape   Review  of Systems Review of Systems  Constitutional:  Negative for chills and fever.  HENT:  Positive for sore throat. Negative for ear pain.   Respiratory:  Negative for cough and shortness of breath.   Cardiovascular:  Negative for chest pain and palpitations.  Skin:  Negative for rash.     Physical Exam Triage Vital Signs ED Triage Vitals  Enc Vitals Group     BP 10/13/22 1430 124/81     Pulse Rate 10/13/22 1426 78     Resp 10/13/22 1426 18     Temp 10/13/22 1426 97.7 F (36.5 C)     Temp src --      SpO2 10/13/22 1426 96 %     Weight --      Height --      Head Circumference --      Peak Flow --      Pain Score 10/13/22 1428 5     Pain Loc --      Pain Edu? --      Excl. in GC? --    No data found.  Updated Vital  Signs BP 124/81   Pulse 78   Temp 97.7 F (36.5 C)   Resp 18   SpO2 96%   Visual Acuity Right Eye Distance:   Left Eye Distance:   Bilateral Distance:    Right Eye Near:   Left Eye Near:    Bilateral Near:     Physical Exam Vitals and nursing note reviewed.  Constitutional:      General: He is not in acute distress.    Appearance: Normal appearance. He is well-developed. He is not ill-appearing.  HENT:     Head: Normocephalic and atraumatic.     Right Ear: Tympanic membrane normal.     Left Ear: Tympanic membrane normal.     Nose: Nose normal.     Mouth/Throat:     Mouth: Mucous membranes are moist.     Pharynx: Oropharynx is clear.     Comments: Clear PND. Cardiovascular:     Rate and Rhythm: Normal rate and regular rhythm.     Heart sounds: Normal heart sounds.  Pulmonary:     Effort: Pulmonary effort is normal. No respiratory distress.     Breath sounds: Normal breath sounds.  Musculoskeletal:     Cervical back: Neck supple.  Skin:    General: Skin is warm and dry.  Neurological:     Mental Status: He is alert.  Psychiatric:        Mood and Affect: Mood normal.        Behavior: Behavior normal.      UC Treatments / Results  Labs (all labs ordered are listed, but only abnormal results are displayed) Labs Reviewed  CULTURE, GROUP A STREP [facility])  POCT RAPID STREP A (OFFICE)    EKG   Radiology No results found.  Procedures Procedures (including critical care time)  Medications Ordered in UC Medications - No data to display  Initial Impression / Assessment and Plan / UC Course  I have reviewed the triage vital signs and the nursing notes.  Pertinent labs & imaging results that were available during my care of the patient were reviewed by me and considered in my medical decision making (see chart for details).    Your throat, exposure to strep.  Rapid strep negative; throat culture pending.  Patient has had strep twice in the last 2 months.  He  has a wife and children  currently have strep.  Discussed that we will call if the throat culture shows the need for treatment.  Also instructed patient to be retested if he is still symptomatic in 2 to 3 days.  Tylenol or ibuprofen as needed.  Education provided on sore throat.  Patient agrees to plan of care.  Final Clinical Impressions(s) / UC Diagnoses   Final diagnoses:  Sore throat  Exposure to strep throat     Discharge Instructions      Your rapid strep test is negative.  A throat culture is pending; we will call you if it is positive requiring treatment.    Follow up with your primary care provider if your symptoms are not improving.         ED Prescriptions   None    PDMP not reviewed this encounter.   Mickie Bail, NP 10/13/22 1451

## 2022-10-13 NOTE — ED Triage Notes (Signed)
Patient to Urgent Care with complaints of sore throat that started 2-3 days ago. Denies any fevers. Reports wife and three children are currently strep positive.

## 2022-10-13 NOTE — Discharge Instructions (Addendum)
Your rapid strep test is negative.  A throat culture is pending; we will call you if it is positive requiring treatment.    Follow up with your primary care provider if your symptoms are not improving.     

## 2022-10-14 LAB — CULTURE, GROUP A STREP (THRC)

## 2022-10-15 LAB — CULTURE, GROUP A STREP (THRC)

## 2022-10-16 LAB — CULTURE, GROUP A STREP (THRC)
